# Patient Record
Sex: Female | Born: 1955
Health system: Southern US, Community
[De-identification: ages and names within clinical notes are randomized; demographics above are authoritative.]

## PROBLEM LIST (undated history)

## (undated) DIAGNOSIS — J45909 Unspecified asthma, uncomplicated: Secondary | ICD-10-CM

---

## 1998-12-15 ENCOUNTER — Emergency Department (HOSPITAL_COMMUNITY): Admission: EM | Admit: 1998-12-15 | Discharge: 1998-12-15 | Payer: Self-pay | Admitting: Emergency Medicine

## 1998-12-15 ENCOUNTER — Encounter: Payer: Self-pay | Admitting: Emergency Medicine

## 2003-08-04 ENCOUNTER — Emergency Department (HOSPITAL_COMMUNITY): Admission: EM | Admit: 2003-08-04 | Discharge: 2003-08-04 | Payer: Self-pay | Admitting: Emergency Medicine

## 2004-11-19 ENCOUNTER — Inpatient Hospital Stay (HOSPITAL_COMMUNITY): Admission: AD | Admit: 2004-11-19 | Discharge: 2004-11-19 | Payer: Self-pay | Admitting: *Deleted

## 2004-11-22 ENCOUNTER — Inpatient Hospital Stay (HOSPITAL_COMMUNITY): Admission: AD | Admit: 2004-11-22 | Discharge: 2004-11-22 | Payer: Self-pay | Admitting: *Deleted

## 2011-03-24 ENCOUNTER — Observation Stay (HOSPITAL_COMMUNITY)
Admission: EM | Admit: 2011-03-24 | Discharge: 2011-03-24 | Disposition: A | Discharge status: Home / Self Care | Payer: Self-pay | Attending: Emergency Medicine | Admitting: Emergency Medicine

## 2011-03-24 DIAGNOSIS — J45909 Unspecified asthma, uncomplicated: Principal | ICD-10-CM | POA: Insufficient documentation

## 2013-07-24 ENCOUNTER — Ambulatory Visit: Payer: Self-pay | Admitting: Emergency Medicine

## 2013-07-24 VITALS — BP 120/80 | HR 96 | Temp 98.3°F | Resp 12 | Ht 62.75 in | Wt 146.0 lb

## 2013-07-24 DIAGNOSIS — J018 Other acute sinusitis: Secondary | ICD-10-CM

## 2013-07-24 DIAGNOSIS — J209 Acute bronchitis, unspecified: Secondary | ICD-10-CM

## 2013-07-24 MED ORDER — ALBUTEROL SULFATE (2.5 MG/3ML) 0.083% IN NEBU: 5.0000 mg | INHALATION_SOLUTION | RESPIRATORY_TRACT | Status: DC | PRN

## 2013-07-24 MED ORDER — AMOXICILLIN-POT CLAVULANATE 875-125 MG PO TABS: 1.0000 | ORAL_TABLET | Freq: Two times a day (BID) | ORAL | Status: DC

## 2013-07-24 MED ORDER — PROMETHAZINE-CODEINE 6.25-10 MG/5ML PO SYRP: 5.0000 mL | ORAL_SOLUTION | Freq: Four times a day (QID) | ORAL | Status: DC | PRN

## 2013-07-24 MED ORDER — PSEUDOEPHEDRINE-GUAIFENESIN ER 60-600 MG PO TB12: 1.0000 | ORAL_TABLET | Freq: Two times a day (BID) | ORAL | Status: AC

## 2013-07-24 NOTE — Addendum Note (Signed)
Addended by: Carmelina DaneANDERSON, Lejla Moeser S on: 07/24/2013 10:52 AM   Modules accepted: Orders

## 2013-07-24 NOTE — Patient Instructions (Signed)
bBronchitis Bronchitis is inflammation of the airways that extend from the windpipe into the lungs (bronchi). The inflammation often causes mucus to develop, which leads to a cough. If the inflammation becomes severe, it may cause shortness of breath. CAUSES  Bronchitis may be caused by:   Viral infections.   Bacteria.   Cigarette smoke.   Allergens, pollutants, and other irritants.  SIGNS AND SYMPTOMS  The most common symptom of bronchitis is a frequent cough that produces mucus. Other symptoms include:  Fever.   Body aches.   Chest congestion.   Chills.   Shortness of breath.   Sore throat.  DIAGNOSIS  Bronchitis is usually diagnosed through a medical history and physical exam. Tests, such as chest X-rays, are sometimes done to rule out other conditions.  TREATMENT  You may need to avoid contact with whatever caused the problem (smoking, for example). Medicines are sometimes needed. These may include:  Antibiotics. These may be prescribed if the condition is caused by bacteria.  Cough suppressants. These may be prescribed for relief of cough symptoms.   Inhaled medicines. These may be prescribed to help open your airways and make it easier for you to breathe.   Steroid medicines. These may be prescribed for those with recurrent (chronic) bronchitis. HOME CARE INSTRUCTIONS  Get plenty of rest.   Drink enough fluids to keep your urine clear or pale yellow (unless you have a medical condition that requires fluid restriction). Increasing fluids may help thin your secretions and will prevent dehydration.   Only take over-the-counter or prescription medicines as directed by your health care provider.  Only take antibiotics as directed. Make sure you finish them even if you start to feel better.  Avoid secondhand smoke, irritating chemicals, and strong fumes. These will make bronchitis worse. If you are a smoker, quit smoking. Consider using nicotine gum or  skin patches to help control withdrawal symptoms. Quitting smoking will help your lungs heal faster.   Put a cool-mist humidifier in your bedroom at night to moisten the air. This may help loosen mucus. Change the water in the humidifier daily. You can also run the hot water in your shower and sit in the bathroom with the door closed for 5 10 minutes.   Follow up with your health care provider as directed.   Wash your hands frequently to avoid catching bronchitis again or spreading an infection to others.  SEEK MEDICAL CARE IF: Your symptoms do not improve after 1 week of treatment.  SEEK IMMEDIATE MEDICAL CARE IF:  Your fever increases.  You have chills.   You have chest pain.   You have worsening shortness of breath.   You have bloody sputum.  You faint.  You have lightheadedness.  You have a severe headache.   You vomit repeatedly. MAKE SURE YOU:   Understand these instructions.  Will watch your condition.  Will get help right away if you are not doing well or get worse. Document Released: 05/19/2005 Document Revised: 03/09/2013 Document Reviewed: 01/11/2013 Exit[employer] Patient Information 2014 [employer]. Sinusitis Sinusitis is redness, soreness, and swelling (inflammation) of the paranasal sinuses. Paranasal sinuses are air pockets within the bones of your face (beneath the eyes, the middle of the forehead, or above the eyes). In healthy paranasal sinuses, mucus is able to drain out, and air is able to circulate through them by way of your nose. However, when your paranasal sinuses are inflamed, mucus and air can become trapped. This can allow bacteria and other   germs to grow and cause infection. Sinusitis can develop quickly and last only a short time (acute) or continue over a long period (chronic). Sinusitis that lasts for more than 12 weeks is considered chronic.  CAUSES  Causes of sinusitis include:  Allergies.  Structural abnormalities, such as  displacement of the cartilage that separates your nostrils (deviated septum), which can decrease the air flow through your nose and sinuses and affect sinus drainage.  Functional abnormalities, such as when the small hairs (cilia) that line your sinuses and help remove mucus do not work properly or are not present. SYMPTOMS  Symptoms of acute and chronic sinusitis are the same. The primary symptoms are pain and pressure around the affected sinuses. Other symptoms include:  Upper toothache.  Earache.  Headache.  Bad breath.  Decreased sense of smell and taste.  A cough, which worsens when you are lying flat.  Fatigue.  Fever.  Thick drainage from your nose, which often is green and may contain pus (purulent).  Swelling and warmth over the affected sinuses. DIAGNOSIS  Your caregiver will perform a physical exam. During the exam, your caregiver may:  Look in your nose for signs of abnormal growths in your nostrils (nasal polyps).  Tap over the affected sinus to check for signs of infection.  View the inside of your sinuses (endoscopy) with a special imaging device with a light attached (endoscope), which is inserted into your sinuses. If your caregiver suspects that you have chronic sinusitis, one or more of the following tests may be recommended:  Allergy tests.  Nasal culture A sample of mucus is taken from your nose and sent to a lab and screened for bacteria.  Nasal cytology A sample of mucus is taken from your nose and examined by your caregiver to determine if your sinusitis is related to an allergy. TREATMENT  Most cases of acute sinusitis are related to a viral infection and will resolve on their own within 10 days. Sometimes medicines are prescribed to help relieve symptoms (pain medicine, decongestants, nasal steroid sprays, or saline sprays).  However, for sinusitis related to a bacterial infection, your caregiver will prescribe antibiotic medicines. These are  medicines that will help kill the bacteria causing the infection.  Rarely, sinusitis is caused by a fungal infection. In theses cases, your caregiver will prescribe antifungal medicine. For some cases of chronic sinusitis, surgery is needed. Generally, these are cases in which sinusitis recurs more than 3 times per year, despite other treatments. HOME CARE INSTRUCTIONS   Drink plenty of water. Water helps thin the mucus so your sinuses can drain more easily.  Use a humidifier.  Inhale steam 3 to 4 times a day (for example, sit in the bathroom with the shower running).  Apply a warm, moist washcloth to your face 3 to 4 times a day, or as directed by your caregiver.  Use saline nasal sprays to help moisten and clean your sinuses.  Take over-the-counter or prescription medicines for pain, discomfort, or fever only as directed by your caregiver. SEEK IMMEDIATE MEDICAL CARE IF:  You have increasing pain or severe headaches.  You have nausea, vomiting, or drowsiness.  You have swelling around your face.  You have vision problems.  You have a stiff neck.  You have difficulty breathing. MAKE SURE YOU:   Understand these instructions.  Will watch your condition.  Will get help right away if you are not doing well or get worse. Document Released: 05/19/2005 Document Revised: 08/11/2011 Document Reviewed:   06/03/2011 Exit[employer] Patient Information 2014 [employer].  

## 2013-07-24 NOTE — Addendum Note (Signed)
Addended by: Eddie CandleLUCK, Jayma Volpi L on: 07/24/2013 11:08 AM   Modules accepted: Orders

## 2013-07-24 NOTE — Progress Notes (Signed)
[facility] [address], [place] [address] [phone]  Date:  07/24/2013   Name:  Brenda Gibbonheria A Neuzil   DOB:  1955/06/05   MRN:  [id number]  PCP:  No primary provider on file.    Chief Complaint: Otalgia, Cough, Sore Throat, Chest Pain and Generalized Body Aches   History of Present Illness:  Brenda Armstrong is a 58 y.o. very pleasant female patient who presents with the following:  Ill since Tuesday with nasal congestion and mucopurulent drainage, post nasal drip.  Has a cough productive of purulent sputum.  Some wheezing; history of asthma. No shortness of breath. No improvement with wheezing. Having back pain with protracted coughing.  No nausea or vomiting.  No stool change.  No rash.  No improvement with over the counter medications or other home remedies. Denies other complaint or health concern today.   There are no active problems to display for this patient.   No past medical history on file.  No past surgical history on file.  History  Substance Use Topics  . Smoking status: Former Smoker -- 0.10 packs/day for 2 years    Types: Cigarettes    Quit date: 07/25/2011  . Smokeless tobacco: Never Used  . Alcohol Use: Not on file    Family History  Problem Relation Age of Onset  . Heart disease Sister     No Known Allergies  Medication list has been reviewed and updated.  No current outpatient prescriptions on file prior to visit.   No current facility-administered medications on file prior to visit.    Review of Systems:  As per HPI, otherwise negative.    Physical Examination: Filed Vitals:   07/24/13 1030  BP: 120/80  Pulse: 96  Temp: 98.3 F (36.8 C)  Resp: 12   Filed Vitals:   07/24/13 1030  Height: 5' 2.75" (1.594 m)  Weight: 146 lb (66.225 kg)   Body mass index is 26.06 kg/(m^2). Ideal Body Weight: Weight in (lb) to have BMI = 25: 139.7  GEN: WDWN, NAD, Non-toxic, A & O x 3 HEENT: Atraumatic,  Normocephalic. Neck supple. No masses, No LAD. Ears and Nose: No external deformity. CV: RRR, No M/G/R. No JVD. No thrill. No extra heart sounds. PULM: CTA B, no wheezes, crackles, rhonchi. No retractions. No resp. distress. No accessory muscle use. ABD: S, NT, ND, +BS. No rebound. No HSM. EXTR: No c/c/e NEURO Normal gait.  PSYCH: Normally interactive. Conversant. Not depressed or anxious appearing.  Calm demeanor.    Assessment and Plan: Bronchitis with bronchospasm Albuterol mucinex d Phen c cod augmentin   Signed,  Phillips OdorJeffery Cintia Gleed, MD

## 2013-09-30 ENCOUNTER — Ambulatory Visit (INDEPENDENT_AMBULATORY_CARE_PROVIDER_SITE_OTHER): Payer: No Typology Code available for payment source | Admitting: Emergency Medicine

## 2013-09-30 VITALS — BP 110/62 | HR 65 | Temp 98.3°F | Resp 16 | Ht 62.75 in | Wt 145.4 lb

## 2013-09-30 DIAGNOSIS — S43499A Other sprain of unspecified shoulder joint, initial encounter: Secondary | ICD-10-CM

## 2013-09-30 DIAGNOSIS — S46819A Strain of other muscles, fascia and tendons at shoulder and upper arm level, unspecified arm, initial encounter: Secondary | ICD-10-CM

## 2013-09-30 MED ORDER — ACETAMINOPHEN-CODEINE #3 300-30 MG PO TABS: 1.0000 | ORAL_TABLET | ORAL | Status: DC | PRN

## 2013-09-30 MED ORDER — NAPROXEN SODIUM 550 MG PO TABS: 550.0000 mg | ORAL_TABLET | Freq: Two times a day (BID) | ORAL | Status: DC

## 2013-09-30 MED ORDER — CYCLOBENZAPRINE HCL 10 MG PO TABS: 10.0000 mg | ORAL_TABLET | Freq: Three times a day (TID) | ORAL | Status: DC | PRN

## 2013-09-30 NOTE — Progress Notes (Signed)
[facility] [address], [place] [address] [phone]  Date:  09/30/2013   Name:  Brenda Armstrong   DOB:  1956/04/23   MRN:  [id number]  PCP:  No PCP Per Patient    Chief Complaint: Arm Pain   History of Present Illness:  Brenda Armstrong is a 58 y.o. very pleasant female patient who presents with the following:  Patient has a spontaneous pain in the left shoulder over the past month or so in the absence of injury or overuse.  Pain starts in posterior shoulder and passes posteriorly down posterior upper arm to midway.  No weakness or tingling in arm.  No neck pain.  Says it is so bad she cannot reach her ear with that hand.  Nothing but rest seems to improve the pain.  No improvement with over the counter medications or other home remedies. Denies other complaint or health concern today.   There are no active problems to display for this patient.   History reviewed. No pertinent past medical history.  History reviewed. No pertinent past surgical history.  History  Substance Use Topics  . Smoking status: Former Smoker -- 0.10 packs/day for 2 years    Types: Cigarettes    Quit date: 07/25/2011  . Smokeless tobacco: Never Used  . Alcohol Use: Not on file    Family History  Problem Relation Age of Onset  . Heart disease Sister     No Known Allergies  Medication list has been reviewed and updated.  Current Outpatient Prescriptions on File Prior to Visit  Medication Sig Dispense Refill  . albuterol (PROVENTIL HFA;VENTOLIN HFA) 108 (90 BASE) MCG/ACT inhaler Inhale 2 puffs into the lungs every 6 (six) hours as needed for wheezing or shortness of breath.      Marland Kitchen. albuterol (PROVENTIL) (2.5 MG/3ML) 0.083% nebulizer solution Take 6 mLs (5 mg total) by nebulization every 4 (four) hours as needed for wheezing or shortness of breath.  150 mL  1  . budesonide-formoterol (SYMBICORT) 80-4.5 MCG/ACT inhaler Inhale 2 puffs into the lungs 2 (two) times daily.       Marland Kitchen. amoxicillin-clavulanate (AUGMENTIN) 875-125 MG per tablet Take 1 tablet by mouth 2 (two) times daily.  20 tablet  0  . promethazine-codeine (PHENERGAN WITH CODEINE) 6.25-10 MG/5ML syrup Take 5-10 mLs by mouth every 6 (six) hours as needed.  120 mL  0  . pseudoephedrine-guaifenesin (MUCINEX D) 60-600 MG per tablet Take 1 tablet by mouth every 12 (twelve) hours.  18 tablet  0   No current facility-administered medications on file prior to visit.    Review of Systems:  As per HPI, otherwise negative.    Physical Examination: Filed Vitals:   09/30/13 1053  BP: 110/62  Pulse: 65  Temp: 98.3 F (36.8 C)  Resp: 16   Filed Vitals:   09/30/13 1053  Height: 5' 2.75" (1.594 m)  Weight: 145 lb 6.4 oz (65.953 kg)   Body mass index is 25.96 kg/(m^2). Ideal Body Weight: Weight in (lb) to have BMI = 25: 139.7   GEN: WDWN, NAD, Non-toxic, Alert & Oriented x 3 HEENT: Atraumatic, Normocephalic.  Ears and Nose: No external deformity. EXTR: No clubbing/cyanosis/edema NEURO: Normal gait.  PSYCH: Normally interactive. Conversant. Not depressed or anxious appearing.  Calm demeanor.  Shoulder:  Full PROM.  Tender trapezius.  Neuro grossly intact.  Motor intact Assessment and Plan: Trapezius strain Anaprox Flexeril Local heat   Signed,  Phillips OdorJeffery Stacey Maura, MD

## 2013-09-30 NOTE — Patient Instructions (Signed)
   Shoulder Pain The shoulder is the joint that connects your arms to your body. The bones that form the shoulder joint include the upper arm bone (humerus), the shoulder blade (scapula), and the collarbone (clavicle). The top of the humerus is shaped like a ball and fits into a rather flat socket on the scapula (glenoid cavity). A combination of muscles and strong, fibrous tissues that connect muscles to bones (tendons) support your shoulder joint and hold the ball in the socket. Small, fluid-filled sacs (bursae) are located in different areas of the joint. They act as cushions between the bones and the overlying soft tissues and help reduce friction between the gliding tendons and the bone as you move your arm. Your shoulder joint allows a wide range of motion in your arm. This range of motion allows you to do things like scratch your back or throw a ball. However, this range of motion also makes your shoulder more prone to pain from overuse and injury. Causes of shoulder pain can originate from both injury and overuse and usually can be grouped in the following four categories:  Redness, swelling, and pain (inflammation) of the tendon (tendinitis) or the bursae (bursitis).  Instability, such as a dislocation of the joint.  Inflammation of the joint (arthritis).  Broken bone (fracture). HOME CARE INSTRUCTIONS   Apply ice to the sore area.  Put ice in a plastic bag.  Place a towel between your skin and the bag.  Leave the ice on for 15-20 minutes, 03-04 times per day for the first 2 days.  Stop using cold packs if they do not help with the pain.  If you have a shoulder sling or immobilizer, wear it as long as your caregiver instructs. Only remove it to shower or bathe. Move your arm as little as possible, but keep your hand moving to prevent swelling.  Squeeze a soft ball or foam pad as much as possible to help prevent swelling.  Only take over-the-counter or prescription medicines for  pain, discomfort, or fever as directed by your caregiver. SEEK MEDICAL CARE IF:   Your shoulder pain increases, or new pain develops in your arm, hand, or fingers.  Your hand or fingers become cold and numb.  Your pain is not relieved with medicines. SEEK IMMEDIATE MEDICAL CARE IF:   Your arm, hand, or fingers are numb or tingling.  Your arm, hand, or fingers are significantly swollen or turn white or blue. MAKE SURE YOU:   Understand these instructions.  Will watch your condition.  Will get help right away if you are not doing well or get worse. Document Released: 02/26/2005 Document Revised: 02/11/2012 Document Reviewed: 05/03/2011 [employer] Patient Information 2014 [employer].  

## 2014-09-04 ENCOUNTER — Ambulatory Visit (INDEPENDENT_AMBULATORY_CARE_PROVIDER_SITE_OTHER): Payer: 59

## 2014-09-04 ENCOUNTER — Ambulatory Visit (INDEPENDENT_AMBULATORY_CARE_PROVIDER_SITE_OTHER): Payer: 59 | Admitting: Family Medicine

## 2014-09-04 VITALS — BP 122/82 | HR 90 | Temp 98.3°F | Resp 17 | Ht 63.0 in | Wt 159.0 lb

## 2014-09-04 DIAGNOSIS — R0989 Other specified symptoms and signs involving the circulatory and respiratory systems: Secondary | ICD-10-CM

## 2014-09-04 DIAGNOSIS — R062 Wheezing: Secondary | ICD-10-CM

## 2014-09-04 DIAGNOSIS — Z8709 Personal history of other diseases of the respiratory system: Secondary | ICD-10-CM

## 2014-09-04 DIAGNOSIS — R0602 Shortness of breath: Secondary | ICD-10-CM | POA: Diagnosis not present

## 2014-09-04 DIAGNOSIS — R059 Cough, unspecified: Secondary | ICD-10-CM

## 2014-09-04 DIAGNOSIS — J209 Acute bronchitis, unspecified: Secondary | ICD-10-CM

## 2014-09-04 DIAGNOSIS — R05 Cough: Secondary | ICD-10-CM | POA: Diagnosis not present

## 2014-09-04 LAB — POCT CBC
Granulocyte percent: 66.1 %G (ref 37–80)
HCT, POC: 41.9 % (ref 37.7–47.9)
HEMOGLOBIN: 13 g/dL (ref 12.2–16.2)
LYMPH, POC: 2.7 (ref 0.6–3.4)
MCH, POC: 26.8 pg — AB (ref 27–31.2)
MCHC: 31 g/dL — AB (ref 31.8–35.4)
MCV: 86.6 fL (ref 80–97)
MID (cbc): 0.4 (ref 0–0.9)
MPV: 7.4 fL (ref 0–99.8)
POC GRANULOCYTE: 6.1 (ref 2–6.9)
POC LYMPH PERCENT: 29.3 %L (ref 10–50)
POC MID %: 4.6 %M (ref 0–12)
Platelet Count, POC: 199 10*3/uL (ref 142–424)
RBC: 4.84 M/uL (ref 4.04–5.48)
RDW, POC: 15.7 %
WBC: 9.2 10*3/uL (ref 4.6–10.2)

## 2014-09-04 MED ORDER — AZITHROMYCIN 500 MG PO TABS: 500.0000 mg | ORAL_TABLET | Freq: Every day | ORAL | Status: DC

## 2014-09-04 MED ORDER — HYDROCODONE-HOMATROPINE 5-1.5 MG/5ML PO SYRP: 5.0000 mL | ORAL_SOLUTION | Freq: Every evening | ORAL | Status: DC | PRN

## 2014-09-04 MED ORDER — PREDNISONE 20 MG PO TABS: 40.0000 mg | ORAL_TABLET | Freq: Every day | ORAL | Status: AC

## 2014-09-04 NOTE — Patient Instructions (Signed)

## 2014-09-04 NOTE — Progress Notes (Signed)
MRN: [id number] DOB: Jun 21, 1955  Subjective:   Brenda Armstrong is a 59 y.o. female with pmh of asthma presenting for chief complaint of Cough; Nasal Congestion; Allergies; Asthma; Hot Flashes; Insomnia; and Generalized Body Aches  Reports 3 day history of cough productive with sputum, cough worse at night, subjective fever, chills, sinus pressure, shob, mild wheezing. Patient also admits that she has had intermittent RUQ pain, pain is achy in nature, sometimes elicited by coughing, radiates to right shoulder; this pain also started in the last 3 days. She has a history of asthma, currently using albuterol inhaler every 4 hours, tried nebulizer x1 yesterday with some improvement. Generally, she uses albuterol as needed, 3-5 times per week. Admits history of seasonal allergies, has not tried any allergy medicine. Denies itchy or watery eyes, sinus pain, rhinorrhea, ear pain, ear drainage, throat pain, chest pain, n/v, diarrhea. Smokes ~1 cigarette per day. Drinks 1-2 tequila shots per day. Denies any other aggravating or relieving factors, no other questions or concerns.  Brenda Armstrong has a current medication list which includes the following prescription(s): acetaminophen-codeine and albuterol. She has No Known Allergies.  Brenda Armstrong  has no past medical history on file. Also  has no past surgical history on file.  ROS As in subjective.  Objective:   Vitals: BP 122/82 mmHg  Pulse 90  Temp(Src) 98.3 F (36.8 C) (Oral)  Resp 17  Ht 5\' 3"  (1.6 m)  Wt 159 lb (72.122 kg)  BMI 28.17 kg/m2  SpO2 97%  Physical Exam  Constitutional: She is oriented to person, place, and time and well-developed, well-nourished, and in no distress.  HENT:  TM's intact bilaterally, no effusions or erythema. Nasal turbinates pink and moist with mild clear-yellow rhinorrhea. No sinus tenderness. No oropharyngeal exudates, erythema or abscesses.   Eyes: Conjunctivae and EOM are normal. Right eye exhibits no discharge.  Left eye exhibits no discharge. No scleral icterus.  Cardiovascular: Normal rate, regular rhythm and intact distal pulses.  Exam reveals no gallop and no friction rub.   No murmur heard. Pulmonary/Chest: No stridor. No respiratory distress. She has wheezes (diffuse expiratory wheezing). She has no rales. She exhibits no tenderness.  Abdominal: She exhibits no distension and no mass. There is no tenderness.  Lymphadenopathy:    She has no cervical adenopathy.  Neurological: She is alert and oriented to person, place, and time.  Skin: Skin is warm and dry. No rash noted. No erythema. No pallor.    [facility] reading (PRIMARY) by  Dr. Milus GlazierLauenstein and PA-Mani. Chest x-ray: Increased interstitial markings.  Dg Chest 2 View  09/04/2014   CLINICAL DATA:  Three-day history of difficulty breathing with cough and congestion  EXAM: CHEST  2 VIEW  COMPARISON:  None.  FINDINGS: There is no edema or consolidation. The heart size and pulmonary vascularity are normal. No adenopathy. No bone lesions.  IMPRESSION: No edema or consolidation.   Electronically Signed   By: Bretta BangWilliam  Woodruff III M.D.   On: 09/04/2014 09:45   Results for orders placed or performed in visit on 09/04/14 (from the past 24 hour(s))  POCT CBC     Status: Abnormal   Collection Time: 09/04/14  8:42 AM  Result Value Ref Range   WBC 9.2 4.6 - 10.2 K/uL   Lymph, poc 2.7 0.6 - 3.4   POC LYMPH PERCENT 29.3 10 - 50 %L   MID (cbc) 0.4 0 - 0.9   POC MID % 4.6 0 - 12 %M   POC Granulocyte  6.1 2 - 6.9   Granulocyte percent 66.1 37 - 80 %G   RBC 4.84 4.04 - 5.48 M/uL   Hemoglobin 13.0 12.2 - 16.2 g/dL   HCT, POC 16.1 09.6 - 47.9 %   MCV 86.6 80 - 97 fL   MCH, POC 26.8 (A) 27 - 31.2 pg   MCHC 31.0 (A) 31.8 - 35.4 g/dL   RDW, POC 04.5 %   Platelet Count, POC 199 142 - 424 K/uL   MPV 7.4 0 - 99.8 fL   Assessment and Plan :   1. Wheezing 2. Chest congestion 3. Cough 4. Shortness of breath 5. History of asthma 6. Acute bronchitis, unspecified  organism - Start azithromycin, prednisone x5 days, nebulizer treatment at home (patient declined nebulizer treatment in clinic), advised to stop smoking and patient agreed. Consider inhaled steroid if symptoms persist. - Patient declined CMet today, discussed RUQ differential including gall stones, cholecystitis, hepatitis, patient verbalized understanding and will return to clinic for a lab draw once she gets her insurance situated. Will return to clinic if symptoms do not resolve or worsen.  Wallis Bamberg, PA-C [facility] [phone] 09/04/2014 8:16 AM

## 2015-05-28 ENCOUNTER — Emergency Department (HOSPITAL_COMMUNITY): Payer: Worker's Compensation

## 2015-05-28 ENCOUNTER — Encounter (HOSPITAL_COMMUNITY): Payer: Self-pay

## 2015-05-28 ENCOUNTER — Emergency Department (HOSPITAL_COMMUNITY)
Admission: EM | Admit: 2015-05-28 | Discharge: 2015-05-28 | Disposition: A | Discharge status: Home / Self Care | Payer: Worker's Compensation | Attending: Emergency Medicine | Admitting: Emergency Medicine

## 2015-05-28 DIAGNOSIS — M546 Pain in thoracic spine: Secondary | ICD-10-CM | POA: Insufficient documentation

## 2015-05-28 DIAGNOSIS — M79601 Pain in right arm: Secondary | ICD-10-CM | POA: Diagnosis present

## 2015-05-28 DIAGNOSIS — M542 Cervicalgia: Secondary | ICD-10-CM | POA: Diagnosis not present

## 2015-05-28 DIAGNOSIS — Z79899 Other long term (current) drug therapy: Secondary | ICD-10-CM | POA: Diagnosis not present

## 2015-05-28 DIAGNOSIS — T148 Other injury of unspecified body region: Secondary | ICD-10-CM | POA: Insufficient documentation

## 2015-05-28 DIAGNOSIS — F17210 Nicotine dependence, cigarettes, uncomplicated: Secondary | ICD-10-CM | POA: Insufficient documentation

## 2015-05-28 DIAGNOSIS — T148XXA Other injury of unspecified body region, initial encounter: Secondary | ICD-10-CM

## 2015-05-28 LAB — CBC WITH DIFFERENTIAL/PLATELET
BASOS ABS: 0 10*3/uL (ref 0.0–0.1)
BASOS PCT: 1 %
Eosinophils Absolute: 0.5 10*3/uL (ref 0.0–0.7)
Eosinophils Relative: 8 %
HEMATOCRIT: 38.9 % (ref 36.0–46.0)
HEMOGLOBIN: 12.5 g/dL (ref 12.0–15.0)
LYMPHS PCT: 39 %
Lymphs Abs: 2.3 10*3/uL (ref 0.7–4.0)
MCH: 27.9 pg (ref 26.0–34.0)
MCHC: 32.1 g/dL (ref 30.0–36.0)
MCV: 86.8 fL (ref 78.0–100.0)
Monocytes Absolute: 0.3 10*3/uL (ref 0.1–1.0)
Monocytes Relative: 5 %
NEUTROS ABS: 2.8 10*3/uL (ref 1.7–7.7)
NEUTROS PCT: 47 %
Platelets: 183 10*3/uL (ref 150–400)
RBC: 4.48 MIL/uL (ref 3.87–5.11)
RDW: 14.5 % (ref 11.5–15.5)
WBC: 5.8 10*3/uL (ref 4.0–10.5)

## 2015-05-28 LAB — BASIC METABOLIC PANEL
ANION GAP: 8 (ref 5–15)
BUN: 11 mg/dL (ref 6–20)
CALCIUM: 9.1 mg/dL (ref 8.9–10.3)
CO2: 25 mmol/L (ref 22–32)
Chloride: 106 mmol/L (ref 101–111)
Creatinine, Ser: 0.74 mg/dL (ref 0.44–1.00)
GLUCOSE: 124 mg/dL — AB (ref 65–99)
POTASSIUM: 4.3 mmol/L (ref 3.5–5.1)
SODIUM: 139 mmol/L (ref 135–145)

## 2015-05-28 LAB — TROPONIN I: Troponin I: <0.03 ng/mL (ref <0.031)

## 2015-05-28 MED ORDER — ONDANSETRON HCL 4 MG/2ML IJ SOLN
4.0000 mg | Freq: Once | INTRAMUSCULAR | Status: AC
  Administered 2015-05-28: 4 mg via INTRAVENOUS
  Filled 2015-05-28: qty 2

## 2015-05-28 MED ORDER — HYDROCODONE-ACETAMINOPHEN 5-325 MG PO TABS: 1.0000 | ORAL_TABLET | Freq: Four times a day (QID) | ORAL | Status: DC | PRN

## 2015-05-28 MED ORDER — CYCLOBENZAPRINE HCL 5 MG PO TABS: 5.0000 mg | ORAL_TABLET | Freq: Three times a day (TID) | ORAL | Status: DC | PRN

## 2015-05-28 MED ORDER — MORPHINE SULFATE (PF) 4 MG/ML IV SOLN
4.0000 mg | Freq: Once | INTRAVENOUS | Status: AC
  Administered 2015-05-28: 4 mg via INTRAVENOUS
  Filled 2015-05-28 (×2): qty 1

## 2015-05-28 NOTE — ED Provider Notes (Signed)
CSN: [id number]     Arrival date & time 05/28/15  [id number] History   First MD Initiated Contact with Patient 05/28/15 [phone]     Chief Complaint  Patient presents with  . Arm Pain     (Consider location/radiation/quality/duration/timing/severity/associated sxs/prior Treatment) HPI Comments: Pt comes in with c/o right arm and back and right cervical spine pain that started about 1 week ago. Denies numbness, weakness or sob. Denies any recent injury. States that she dropped a large pot on the arm over a month ago but the pain didn't start then. She states that at the time she had a lot of bruising to her upper arm. She has been taking ibuprofen consistently without relief.  The history is provided by the patient.    No past medical history on file. No past surgical history on file. Family History  Problem Relation Age of Onset  . Heart disease Sister    Social History  Substance Use Topics  . Smoking status: Current Some Day Smoker -- 0.10 packs/day for 3 years    Types: Cigarettes    Last Attempt to Quit: 07/25/2011  . Smokeless tobacco: Never Used  . Alcohol Use: Not on file   OB History    No data available     Review of Systems  All other systems reviewed and are negative.     Allergies  Review of patient's allergies indicates no known allergies.  Home Medications   Prior to Admission medications   Medication Sig Start Date End Date Taking? Authorizing Provider  acetaminophen-codeine (TYLENOL #3) 300-30 MG per tablet Take 1-2 tablets by mouth every 4 (four) hours as needed. 09/30/13   Carmelina DaneJeffery S Anderson, MD  albuterol (PROVENTIL HFA;VENTOLIN HFA) 108 (90 BASE) MCG/ACT inhaler Inhale 2 puffs into the lungs every 6 (six) hours as needed for wheezing or shortness of breath.    Historical Provider, MD  azithromycin (ZITHROMAX) 500 MG tablet Take 1 tablet (500 mg total) by mouth daily. 09/04/14   Wallis BambergMario Mani, PA-C  HYDROcodone-homatropine [facility](HYCODAN) 5-1.5 MG/5ML syrup Take 5 mLs by  mouth at bedtime as needed. 09/04/14   Wallis BambergMario Mani, PA-C   BP 170/111 mmHg  Pulse 67  Temp(Src) 97.4 F (36.3 C) (Oral)  Resp 16  Ht 5\' 2"  (1.575 m)  Wt 71.668 kg  BMI 28.89 kg/m2  SpO2 100% Physical Exam  Constitutional: She is oriented to person, place, and time. She appears well-developed and well-nourished.  Cardiovascular: Normal rate and regular rhythm.   Pulmonary/Chest: Effort normal and breath sounds normal.  Musculoskeletal:  Right cervical and thoracic paraspinal tenderness. Pulses intact. Equal grip strength bilaterally  Neurological: She is alert and oriented to person, place, and time. She exhibits normal muscle tone. Coordination normal.  Skin: Skin is warm and dry.  Psychiatric: She has a normal mood and affect.  Nursing note and vitals reviewed.   ED Course  Procedures (including critical care time) Labs Review Labs Reviewed  BASIC METABOLIC PANEL - Abnormal; Notable for the following:    Glucose, Bld 124 (*)    All other components within normal limits  CBC WITH DIFFERENTIAL/PLATELET  TROPONIN I    Imaging Review Dg Chest 2 View  05/28/2015  CLINICAL DATA:  Left upper chest pain for 2 weeks now is unbearable EXAM: CHEST - 2 VIEW COMPARISON:  09/04/2014 FINDINGS: Mild hyperinflation. Peribronchial thickening centrally. Coarse interstitial markings bilaterally, stable. No confluent airspace disease or overt edema. Heart size upper limits normal. Tortuous thoracic aorta. No effusion.  No pneumothorax. Mild spondylitic changes in the mid thoracic spine. IMPRESSION: Chronic interstitial lung disease without acute or superimposed abnormality. Electronically Signed   By: Corlis Leak M.D.   On: 05/28/2015 09:03   I have personally reviewed and evaluated these images and lab results as part of my medical decision-making.  ED ECG REPORT   Date: 05/28/2015  Rate:60  Rhythm: normal sinus rhythm  QRS Axis: normal  Intervals: normal  ST/T Wave abnormalities: normal   Conduction Disutrbances:none  Narrative Interpretation:   Old EKG Reviewed: none available  I have personally reviewed the EKG tracing and agree with the computerized printout as noted.   MDM   Final diagnoses:  Muscle strain    8:12 AM Think likely musculoskeletal in nature however think that a cardiac r/o is needed as the pain is radiating to the neck and back  9:32 AM Pt is feeling a lot better with the medication. Pt is neurologically intact. Dicussed follow up with ortho as needed. Discussed with pt that she needs to have her bp rechecked in the next 2 weeks to be sur it doesn't continue to be elevated  Teressa Lower, NP 05/28/15 1610  Cathren Laine, MD 05/28/15 1220

## 2015-05-28 NOTE — ED Notes (Signed)
EKG given to Rubin PayorPickering, NP. Denton LankSteinl, MD made aware

## 2015-05-28 NOTE — ED Notes (Signed)
NOvember 12 th pt. Was injured when a [employer] skillet fell on her rt. Arm.  She has not pain at the time only bruising. Approximately 1 week after the injury, the painto her rt. Arm began and has progressively got worse.  Pain radiates into her rt. Lateral neck and scappula area  She is taking Advil 2-3 pills every 2 hours.  Pt. Can move her arm just the pain increases.  +Radial pulse The seems to hurt  More around her rt. Elbow area.   She denies any chest pain or sob, n/v

## 2015-06-03 ENCOUNTER — Emergency Department (HOSPITAL_COMMUNITY): Payer: BLUE CROSS/BLUE SHIELD

## 2015-06-03 ENCOUNTER — Emergency Department (HOSPITAL_COMMUNITY)
Admission: EM | Admit: 2015-06-03 | Discharge: 2015-06-03 | Disposition: A | Discharge status: Home / Self Care | Payer: BLUE CROSS/BLUE SHIELD | Attending: Emergency Medicine | Admitting: Emergency Medicine

## 2015-06-03 ENCOUNTER — Encounter (HOSPITAL_COMMUNITY): Payer: Self-pay | Admitting: *Deleted

## 2015-06-03 DIAGNOSIS — Z79899 Other long term (current) drug therapy: Secondary | ICD-10-CM | POA: Insufficient documentation

## 2015-06-03 DIAGNOSIS — Z87891 Personal history of nicotine dependence: Secondary | ICD-10-CM | POA: Insufficient documentation

## 2015-06-03 DIAGNOSIS — M25511 Pain in right shoulder: Secondary | ICD-10-CM | POA: Diagnosis present

## 2015-06-03 DIAGNOSIS — J45909 Unspecified asthma, uncomplicated: Secondary | ICD-10-CM | POA: Insufficient documentation

## 2015-06-03 DIAGNOSIS — M5412 Radiculopathy, cervical region: Secondary | ICD-10-CM | POA: Diagnosis not present

## 2015-06-03 HISTORY — DX: Unspecified asthma, uncomplicated: J45.909

## 2015-06-03 MED ORDER — HYDROCODONE-ACETAMINOPHEN 5-325 MG PO TABS: 1.0000 | ORAL_TABLET | ORAL | Status: DC | PRN

## 2015-06-03 MED ORDER — PREDNISONE 10 MG (21) PO TBPK: ORAL_TABLET | ORAL | Status: DC

## 2015-06-03 MED ORDER — NAPROXEN 500 MG PO TABS: 500.0000 mg | ORAL_TABLET | Freq: Two times a day (BID) | ORAL | Status: DC | PRN

## 2015-06-03 NOTE — Discharge Instructions (Signed)

## 2015-06-03 NOTE — ED Provider Notes (Signed)
CSN: [id number]     Arrival date & time 06/03/15  4782 History   First MD Initiated Contact with Patient 06/03/15 [phone]     Chief Complaint  Patient presents with  . Arm Pain   Patient is a 60 y.o. female presenting with arm pain.  Arm Pain   Patient presents to the emergency room for evaluation of persistent right shoulder pain. She was initially injured at work when a pan fell on her shoulder. She initially developed a bruise and had pain and discomfort in the area of bruising on her right upper arm. However, since that time she's had persistent pain. She has pain in the neck and right shoulder area that radiates down her arm. It is an aching pain and seems to be worse when her arm is extended period she feels better when she has her elbow flexed and her arm held out in front of her. She denies any numbness or weakness but her right arm feels different than the left. No chest pain or shortness of breath. No abdominal pain. No fever or vomiting. No complaints in her lower extremities. Past Medical History  Diagnosis Date  . Asthma    History reviewed. No pertinent past surgical history. Family History  Problem Relation Age of Onset  . Heart disease Sister    Social History  Substance Use Topics  . Smoking status: Former Smoker -- 0.10 packs/day for 3 years    Types: Cigarettes    Quit date: 07/25/2011  . Smokeless tobacco: Never Used  . Alcohol Use: None   OB History    No data available     Review of Systems  All other systems reviewed and are negative.     Allergies  Review of patient's allergies indicates no known allergies.  Home Medications   Prior to Admission medications   Medication Sig Start Date End Date Taking? Authorizing Provider  albuterol (PROVENTIL HFA;VENTOLIN HFA) 108 (90 BASE) MCG/ACT inhaler Inhale 2 puffs into the lungs every 6 (six) hours as needed for wheezing or shortness of breath.   Yes Historical Provider, MD  cyclobenzaprine (FLEXERIL) 5 MG  tablet Take 1 tablet (5 mg total) by mouth 3 (three) times daily as needed for muscle spasms. 05/28/15  Yes Teressa Lower, NP  HYDROcodone-acetaminophen (NORCO/VICODIN) 5-325 MG tablet Take 1-2 tablets by mouth every 4 (four) hours as needed. 06/03/15   Linwood Dibbles, MD  naproxen (NAPROSYN) 500 MG tablet Take 1 tablet (500 mg total) by mouth 2 (two) times daily as needed for moderate pain. 06/03/15   Linwood Dibbles, MD  predniSONE (STERAPRED UNI-PAK 21 TAB) 10 MG (21) TBPK tablet Take 6 tabs by mouth daily  for 2 days, then 5 tabs for 2 days, then 4 tabs for 2 days, then 3 tabs for 2 days, 2 tabs for 2 days, then 1 tab by mouth daily for 2 days 06/03/15   Linwood Dibbles, MD   BP 144/102 mmHg  Pulse 79  Temp(Src) 97.7 F (36.5 C) (Oral)  Resp 18  Wt 70.761 kg  SpO2 100% Physical Exam  Constitutional: She appears well-developed and well-nourished. No distress.  HENT:  Head: Normocephalic and atraumatic.  Right Ear: External ear normal.  Left Ear: External ear normal.  Eyes: Conjunctivae are normal. Right eye exhibits no discharge. Left eye exhibits no discharge. No scleral icterus.  Neck: Neck supple. No tracheal deviation present.  Cardiovascular: Normal rate.   Pulmonary/Chest: Effort normal. No stridor. No respiratory distress.  Musculoskeletal: She  exhibits no edema.       Right shoulder: She exhibits no tenderness.       Cervical back: She exhibits no tenderness.  Pain with range of motion of the right shoulder, no discrete areas of tenderness, strong radial pulses bilaterally, normal sensation and grip strength bilaterally  Neurological: She is alert. Cranial nerve deficit: no gross deficits.  Skin: Skin is warm and dry. No rash noted.  Psychiatric: She has a normal mood and affect.  Nursing note and vitals reviewed.   ED Course  Procedures   Imaging Review Dg Cervical Spine Complete  06/03/2015  CLINICAL DATA:  Pt c/o RIGHT shoulder and RIGHT neck pain s/p she reports she was hit with a  Piece of heavy equipment 1 month ago at work at the [employer]. Pt states pain has worsened x 2 days. EXAM: CERVICAL SPINE - COMPLETE 4+ VIEW COMPARISON:  None. FINDINGS: The lateral view images through the bottom of C7. Prevertebral soft tissues are within normal limits. Maintenance of vertebral body height. Straightening of expected lordosis. Advanced spondylosis for age, including endplate osteophytes and loss of intervertebral disc height at C3 through C7. Facets are well-aligned. Multilevel neural foraminal narrowing bilaterally secondary to uncovertebral joint hypertrophy. C1-2 level not well evaluated on open-mouth views. No displaced odontoid process fracture seen. Cervical thoracic junction not well evaluated. IMPRESSION: Suboptimal evaluation of the C1-2 and C7-T1 levels. Advanced spondylosis, with bilateral neural foraminal narrowing at multiple levels. Nonspecific straightening of expected lordosis. Electronically Signed   By: Jeronimo GreavesKyle  Talbot M.D.   On: 06/03/2015 10:57   Dg Shoulder Right  06/03/2015  CLINICAL DATA:  Neck and right arm pain. EXAM: RIGHT SHOULDER - 2+ VIEW COMPARISON:  None. FINDINGS: There is no evidence of fracture or dislocation. There is no evidence of arthropathy or other focal bone abnormality. Soft tissues are unremarkable. IMPRESSION: Normal right shoulder. Electronically Signed   By: Lupita RaiderJames  Green Jr, M.D.   On: 06/03/2015 10:41      MDM   Final diagnoses:  Cervical radiculopathy    Patient's x-ray of her shoulder is unremarkable. Her neck x-ray does show a significant amount of spondylosis and bilateral neural foraminal narrowing.  This correlates with her symptoms suggest a cervical radiculopathy. No weakness on my exam. I'll start the patient on pain medications and a course of steroids. I recommended referral to a neurosurgeon. She may benefit from further outpatient testing such as MRI.    Linwood DibblesJon Allure Greaser, MD 06/03/15 [phone]

## 2015-06-03 NOTE — ED Notes (Signed)
Pt states on Nov 12th a pan fell on rt shoulder leaving a bruise, has had pain in shoulder, arm, rt back and ribs. Seen last Monday for same, feels as if is getting worse. Was given Vicodin and Flexeril without relief.

## 2015-12-20 ENCOUNTER — Other Ambulatory Visit (HOSPITAL_COMMUNITY)
Admission: RE | Admit: 2015-12-20 | Discharge: 2015-12-20 | Disposition: A | Discharge status: Home / Self Care | Payer: BLUE CROSS/BLUE SHIELD | Source: Ambulatory Visit | Attending: Physician Assistant | Admitting: Physician Assistant

## 2015-12-20 ENCOUNTER — Other Ambulatory Visit: Payer: Self-pay | Admitting: Physician Assistant

## 2015-12-20 DIAGNOSIS — Z01411 Encounter for gynecological examination (general) (routine) with abnormal findings: Secondary | ICD-10-CM | POA: Diagnosis not present

## 2015-12-20 DIAGNOSIS — Z124 Encounter for screening for malignant neoplasm of cervix: Secondary | ICD-10-CM | POA: Diagnosis not present

## 2015-12-20 DIAGNOSIS — Z1151 Encounter for screening for human papillomavirus (HPV): Secondary | ICD-10-CM | POA: Diagnosis not present

## 2015-12-20 DIAGNOSIS — Z0000 Encounter for general adult medical examination without abnormal findings: Secondary | ICD-10-CM | POA: Diagnosis not present

## 2015-12-20 DIAGNOSIS — Z720 Tobacco use: Secondary | ICD-10-CM | POA: Diagnosis not present

## 2015-12-20 DIAGNOSIS — J45909 Unspecified asthma, uncomplicated: Secondary | ICD-10-CM | POA: Diagnosis not present

## 2015-12-20 DIAGNOSIS — Z13220 Encounter for screening for lipoid disorders: Secondary | ICD-10-CM | POA: Diagnosis not present

## 2015-12-24 LAB — CYTOLOGY - PAP

## 2016-01-23 ENCOUNTER — Other Ambulatory Visit: Payer: Self-pay | Admitting: Gastroenterology

## 2016-01-23 DIAGNOSIS — Z1211 Encounter for screening for malignant neoplasm of colon: Secondary | ICD-10-CM | POA: Diagnosis not present

## 2016-01-23 DIAGNOSIS — K6389 Other specified diseases of intestine: Secondary | ICD-10-CM | POA: Diagnosis not present

## 2016-01-23 DIAGNOSIS — K635 Polyp of colon: Secondary | ICD-10-CM | POA: Diagnosis not present

## 2016-01-23 DIAGNOSIS — K5730 Diverticulosis of large intestine without perforation or abscess without bleeding: Secondary | ICD-10-CM | POA: Diagnosis not present

## 2016-02-05 DIAGNOSIS — E785 Hyperlipidemia, unspecified: Secondary | ICD-10-CM | POA: Diagnosis not present

## 2016-02-05 DIAGNOSIS — J45909 Unspecified asthma, uncomplicated: Secondary | ICD-10-CM | POA: Diagnosis not present

## 2016-02-05 DIAGNOSIS — Z7289 Other problems related to lifestyle: Secondary | ICD-10-CM | POA: Diagnosis not present

## 2016-02-05 DIAGNOSIS — Z720 Tobacco use: Secondary | ICD-10-CM | POA: Diagnosis not present

## 2016-03-26 DIAGNOSIS — Z23 Encounter for immunization: Secondary | ICD-10-CM | POA: Diagnosis not present

## 2016-05-20 DIAGNOSIS — Z23 Encounter for immunization: Secondary | ICD-10-CM | POA: Diagnosis not present

## 2017-01-23 DIAGNOSIS — E785 Hyperlipidemia, unspecified: Secondary | ICD-10-CM | POA: Diagnosis not present

## 2017-01-23 DIAGNOSIS — R7301 Impaired fasting glucose: Secondary | ICD-10-CM | POA: Diagnosis not present

## 2017-01-23 DIAGNOSIS — Z720 Tobacco use: Secondary | ICD-10-CM | POA: Diagnosis not present

## 2017-01-23 DIAGNOSIS — R252 Cramp and spasm: Secondary | ICD-10-CM | POA: Diagnosis not present

## 2017-01-23 DIAGNOSIS — J45909 Unspecified asthma, uncomplicated: Secondary | ICD-10-CM | POA: Diagnosis not present

## 2017-03-17 ENCOUNTER — Encounter (HOSPITAL_COMMUNITY): Payer: Self-pay | Admitting: *Deleted

## 2017-03-17 ENCOUNTER — Ambulatory Visit (HOSPITAL_COMMUNITY)
Admission: EM | Admit: 2017-03-17 | Discharge: 2017-03-17 | Disposition: A | Discharge status: Home / Self Care | Payer: BLUE CROSS/BLUE SHIELD | Attending: Family Medicine | Admitting: Family Medicine

## 2017-03-17 DIAGNOSIS — M79601 Pain in right arm: Secondary | ICD-10-CM

## 2017-03-17 DIAGNOSIS — S56811A Strain of other muscles, fascia and tendons at forearm level, right arm, initial encounter: Secondary | ICD-10-CM | POA: Diagnosis not present

## 2017-03-17 DIAGNOSIS — R030 Elevated blood-pressure reading, without diagnosis of hypertension: Secondary | ICD-10-CM

## 2017-03-17 MED ORDER — CYCLOBENZAPRINE HCL 10 MG PO TABS: ORAL_TABLET | ORAL | 0 refills | Status: AC

## 2017-03-17 MED ORDER — NAPROXEN 500 MG PO TABS: 500.0000 mg | ORAL_TABLET | Freq: Two times a day (BID) | ORAL | 0 refills | Status: AC | PRN

## 2017-03-17 NOTE — ED Triage Notes (Signed)
Patient reports right arm aching, states that the lid to the ice bin feel on her arm at work last week. States arm aches from forearm to shoulder. No deformity noted or bruising.

## 2017-03-17 NOTE — ED Provider Notes (Signed)
[facility]    CSN: [id number] Arrival date & time: 03/17/17  1003     History   Chief Complaint Chief Complaint  Patient presents with  . Arm Pain    HPI Brenda Armstrong is a 61 y.o. female.   61 year old female presents with right arm pain for the past week. Has become more painful in the past 2 days. Pain started after an ice bin lid fell on the top of her right hand and wrist about 1 week ago at work. Has noticed some swelling but no bruising or deformity. Now entire arm is "sore and achy" from hand to shoulder. Has taken Tylenol with minimal relief. Has also used wrist splint for support but went to work today and was unable to lift heavy items due to shooting pain up arm. Other chronic health issues include asthma and hyperlipidemia. She has an Albuterol inhaler as needed.    The history is provided by the patient.    Past Medical History:  Diagnosis Date  . Asthma     There are no active problems to display for this patient.   History reviewed. No pertinent surgical history.  OB History    No data available       Home Medications    Prior to Admission medications   Medication Sig Start Date End Date Taking? Authorizing Provider  albuterol (PROVENTIL HFA;VENTOLIN HFA) 108 (90 BASE) MCG/ACT inhaler Inhale 2 puffs into the lungs every 6 (six) hours as needed for wheezing or shortness of breath.    [provider]  cyclobenzaprine (FLEXERIL) 10 MG tablet Take 1/2 to 1 tablet by mouth every 8 hours as needed for muscle pain/spams 03/17/17   Kathleen Tamm, Ali Lowe, NP  naproxen (NAPROSYN) 500 MG tablet Take 1 tablet (500 mg total) by mouth 2 (two) times daily as needed for moderate pain. 03/17/17   Sudie Grumbling, NP    Family History Family History  Problem Relation Age of Onset  . Heart disease Sister     Social History Social History  Substance Use Topics  . Smoking status: Former Smoker    Packs/day: 0.10    Years: 3.00    Types:  Cigarettes    Quit date: 07/25/2011  . Smokeless tobacco: Never Used  . Alcohol use Not on file     Allergies   Patient has no known allergies.   Review of Systems Review of Systems  Constitutional: Negative for activity change, appetite change, chills, fatigue and fever.  Respiratory: Negative for chest tightness, shortness of breath and wheezing.   Cardiovascular: Negative for chest pain and palpitations.  Gastrointestinal: Negative for abdominal pain, nausea and vomiting.  Musculoskeletal: Positive for arthralgias and myalgias. Negative for back pain, joint swelling, neck pain and neck stiffness.  Skin: Negative for color change, rash and wound.  Allergic/Immunologic: Negative for immunocompromised state.  Neurological: Negative for dizziness, tremors, seizures, syncope, weakness, light-headedness, numbness and headaches.  Hematological: Negative for adenopathy. Does not bruise/bleed easily.  Psychiatric/Behavioral: Negative.      Physical Exam Triage Vital Signs ED Triage Vitals  Enc Vitals Group     BP 03/17/17 1023 (!) 145/97     Pulse Rate 03/17/17 1023 (!) 59     Resp 03/17/17 1023 16     Temp 03/17/17 1023 98 F (36.7 C)     Temp Source 03/17/17 1023 Oral     SpO2 03/17/17 1023 100 %     Weight --  Height --      Head Circumference --      Peak Flow --      Pain Score 03/17/17 1029 10     Pain Loc --      Pain Edu? --      Excl. in GC? --    No data found.   Updated Vital Signs BP (!) 145/97 (BP Location: Left Arm)   Pulse (!) 59   Temp 98 F (36.7 C) (Oral)   Resp 16   SpO2 100%   Visual Acuity Right Eye Distance:   Left Eye Distance:   Bilateral Distance:    Right Eye Near:   Left Eye Near:    Bilateral Near:     Physical Exam  Constitutional: She is oriented to person, place, and time. She appears well-developed and well-nourished. No distress.  HENT:  Head: Normocephalic and atraumatic.  Right Ear: External ear normal.  Left Ear:  External ear normal.  Eyes: Conjunctivae and EOM are normal.  Neck: Normal range of motion.  Cardiovascular: Normal rate and regular rhythm.   Pulmonary/Chest: Effort normal.  Musculoskeletal: Normal range of motion. She exhibits tenderness. She exhibits no edema.       Right forearm: She exhibits tenderness and swelling. She exhibits no edema, no deformity and no laceration.       Arms: Has full range of motion of hand, arm and shoulder. Tender near radial aspect of wrist and forearm. Slight swelling at 1st metacarpal. No bruising. Good pulses and capillary refill. No neuro deficits noted.   Neurological: She is alert and oriented to person, place, and time. She has normal strength. No sensory deficit.  Skin: Skin is warm and dry. Capillary refill takes less than 2 seconds. No rash noted. No erythema.  Psychiatric: She has a normal mood and affect. Her behavior is normal. Judgment and thought content normal.     UC Treatments / Results  Labs (all labs ordered are listed, but only abnormal results are displayed) Labs Reviewed - No data to display  EKG  EKG Interpretation None       Radiology No results found.  Procedures Procedures (including critical care time)  Medications Ordered in UC Medications - No data to display   Initial Impression / Assessment and Plan / UC Course  I have reviewed the triage vital signs and the nursing notes.  Pertinent labs & imaging results that were available during my care of the patient were reviewed by me and considered in my medical decision making (see chart for details).    Discussed that she probably has a mild muscle strain. Reviewed that imaging is not needed at this time.  Recommend start Naproxen  twice a day as directed. May use Flexeril  1/2 to 1 tablet every 8 hours as needed for muscle pain/spasms- use mainly at night since it causes drowsiness. May apply warm compresses to area for comfort- may alternate with ice as  needed. May wear wrist splint for support during the day if helpful. Note written with lifting restrictions at work for the next 5 days. Also monitor blood pressure- may be elevated today due to pain. Recommend follow-up in 5 to 7 days with an Orthopedic or your previous Neurosurgeon if symptoms persist.   Final Clinical Impressions(s) / UC Diagnoses   Final diagnoses:  Right arm pain  Strain of other muscles, fascia and tendons at forearm level, right arm, initial encounter  Elevated blood-pressure reading without diagnosis of hypertension  New Prescriptions Discharge Medication List as of 03/17/2017 10:56 AM       Controlled Substance Prescriptions  Controlled Substance Registry consulted? Not Applicable   Sudie Grumbling, NP 03/18/17 [phone]

## 2017-03-17 NOTE — Discharge Instructions (Addendum)
Recommend start Naproxen  twice a day as directed. May use Flexeril  1/2 to 1 tablet every 8 hours as needed for muscle pain/spasms- use mainly at night. May apply warm compresses to area for comfort- may alternate with ice as needed. May wear wrist splint for support during the day if helpful. Recommend follow-up in 5 to 7 days with an Orthopedic or your previous Neurosurgeon if symptoms persist.

## 2017-03-26 ENCOUNTER — Encounter (HOSPITAL_COMMUNITY): Payer: Self-pay | Admitting: Emergency Medicine

## 2017-03-26 ENCOUNTER — Ambulatory Visit (INDEPENDENT_AMBULATORY_CARE_PROVIDER_SITE_OTHER): Payer: Worker's Compensation

## 2017-03-26 ENCOUNTER — Ambulatory Visit (HOSPITAL_COMMUNITY)
Admission: EM | Admit: 2017-03-26 | Discharge: 2017-03-26 | Disposition: A | Discharge status: Home / Self Care | Payer: Worker's Compensation | Attending: Family Medicine | Admitting: Family Medicine

## 2017-03-26 DIAGNOSIS — S60221D Contusion of right hand, subsequent encounter: Secondary | ICD-10-CM

## 2017-03-26 DIAGNOSIS — M79641 Pain in right hand: Secondary | ICD-10-CM | POA: Diagnosis not present

## 2017-03-26 DIAGNOSIS — S6991XA Unspecified injury of right wrist, hand and finger(s), initial encounter: Secondary | ICD-10-CM | POA: Diagnosis not present

## 2017-03-26 DIAGNOSIS — M25531 Pain in right wrist: Secondary | ICD-10-CM | POA: Diagnosis not present

## 2017-03-26 MED ORDER — TRAMADOL HCL 50 MG PO TABS: 50.0000 mg | ORAL_TABLET | Freq: Four times a day (QID) | ORAL | 0 refills | Status: AC | PRN

## 2017-03-26 MED ORDER — PREDNISONE 10 MG (21) PO TBPK: ORAL_TABLET | ORAL | 0 refills | Status: AC

## 2017-03-26 NOTE — ED Triage Notes (Signed)
Pt sts right hand and arm pain since something fell on arm last week; pt seen here last week for same

## 2017-03-26 NOTE — Discharge Instructions (Signed)
The reason for your hand pain is not certain. The examination of the hand, wrist and forearm does not find any sort of reason for your pain. As uncertain as to whether this may be a nerve pain or something else. The x-ray is normal. Hand, wrist and forearm function is normal. Take the medication as prescribed. Patient to take the prednisone with food each time you take it. This should help with inflammation which often causes pain. Call the above orthopedist for an appointment tomorrow. It was noticed that Dr. Venetia MaxonStern is a neurosurgeon. Is uncertain as to whether this will be an appropriate referral as of now.

## 2017-03-26 NOTE — ED Provider Notes (Signed)
[facility]    CSN: [id number] Arrival date & time: 03/26/17  1556     History   Chief Complaint Chief Complaint  Patient presents with  . Hand Pain    HPI Brenda Armstrong is a 61 y.o. female.   Approximately 2 weeks ago this 61 year old female had her hand in and I spent in the lid slammed down on top of her hand. She presented to the urgent care with hand pain. There was no evidence of fracture at that time. She was only complaining of pain at the area and which than was struck. She was treated with Naprosyn and wrist splint. Patient states that she is continuing to have pain along the ulnar aspect of the hand and wrist that radiates to the elbow. She states she has not been using her hand, using a sling or keeping her arm near her side. The pain is constant. Nothing seems to make it worse including movement although she prefers to keep her arm in a sling or next to her body. She states the area described above has numbness and a sensation of coldness. To touch it is warm. Overall there is no overt evidence of injury or functional limitation to the right upper extremity.      Past Medical History:  Diagnosis Date  . Asthma     There are no active problems to display for this patient.   History reviewed. No pertinent surgical history.  OB History    No data available       Home Medications    Prior to Admission medications   Medication Sig Start Date End Date Taking? Authorizing Provider  albuterol (PROVENTIL HFA;VENTOLIN HFA) 108 (90 BASE) MCG/ACT inhaler Inhale 2 puffs into the lungs every 6 (six) hours as needed for wheezing or shortness of breath.    [provider]  cyclobenzaprine (FLEXERIL) 10 MG tablet Take 1/2 to 1 tablet by mouth every 8 hours as needed for muscle pain/spams 03/17/17   Amyot, Ali LoweAnn Berry, NP  naproxen (NAPROSYN) 500 MG tablet Take 1 tablet (500 mg total) by mouth 2 (two) times daily as needed for moderate pain. 03/17/17    Sudie GrumblingAmyot, Ann Berry, NP  predniSONE (STERAPRED UNI-PAK 21 TAB) 10 MG (21) TBPK tablet Dispense one 6 day pack. Take as directed with food. 03/26/17   Hayden RasmussenMabe, Amanda Steuart, NP  traMADol (ULTRAM) 50 MG tablet Take 1 tablet (50 mg total) by mouth every 6 (six) hours as needed. 03/26/17   Hayden RasmussenMabe, Tae Vonada, NP    Family History Family History  Problem Relation Age of Onset  . Heart disease Sister     Social History Social History  Substance Use Topics  . Smoking status: Former Smoker    Packs/day: 0.10    Years: 3.00    Types: Cigarettes    Quit date: 07/25/2011  . Smokeless tobacco: Never Used  . Alcohol use Not on file     Allergies   Patient has no known allergies.   Review of Systems Review of Systems  Constitutional: Negative.  Negative for activity change, chills and fever.  HENT: Negative.   Respiratory: Negative.   Cardiovascular: Negative.   Musculoskeletal:       As per HPI  Hand pain along ulnar aspect to include wrist, radiating along the length of the forearm   Skin: Negative for color change, pallor and rash.  Neurological: Negative.      Physical Exam Triage Vital Signs ED Triage Vitals  Enc Vitals  Group     BP 03/26/17 1616 (!) 148/99     Pulse Rate 03/26/17 1615 77     Resp 03/26/17 1615 18     Temp 03/26/17 1615 97.9 F (36.6 C)     Temp Source 03/26/17 1615 Oral     SpO2 03/26/17 1615 98 %     Weight --      Height --      Head Circumference --      Peak Flow --      Pain Score 03/26/17 1616 10     Pain Loc --      Pain Edu? --      Excl. in GC? --    No data found.   Updated Vital Signs BP (!) 148/99 (BP Location: Left Arm)   Pulse 77   Temp 97.9 F (36.6 C) (Oral)   Resp 18   SpO2 98%   Visual Acuity Right Eye Distance:   Left Eye Distance:   Bilateral Distance:    Right Eye Near:   Left Eye Near:    Bilateral Near:     Physical Exam  Constitutional: She is oriented to person, place, and time. She appears well-developed and  well-nourished. No distress.  HENT:  Head: Normocephalic and atraumatic.  Eyes: Pupils are equal, round, and reactive to light. EOM are normal.  Neck: Normal range of motion. Neck supple.  Musculoskeletal: She exhibits no edema, tenderness or deformity.  No swelling or deformity. No observed evidence of injury. Full ROM hand, digits, wrist and forearm. Nl color and warmth. Pulses 2+.   Lymphadenopathy:    She has no cervical adenopathy.  Neurological: She is alert and oriented to person, place, and time. No cranial nerve deficit.  Skin: Skin is warm and dry.     UC Treatments / Results  Labs (all labs ordered are listed, but only abnormal results are displayed) Labs Reviewed - No data to display  EKG  EKG Interpretation None       Radiology Dg Wrist Complete Right  Result Date: 03/26/2017 CLINICAL DATA:  Trauma and pain to the wrist EXAM: RIGHT WRIST - COMPLETE 3+ VIEW COMPARISON:  None. FINDINGS: No definite acute displaced fracture or malalignment. Slightly foreshortened appearance of the scaphoid but no lucency is seen. Suture or foreign material along the volar surface of the proximal hand. IMPRESSION: 1. No definite acute osseous abnormality. 2. Sutures or foreign material along the palmar aspect of the hand. Electronically Signed   By: Jasmine Pang M.D.   On: 03/26/2017 17:10    Procedures Procedures (including critical care time)  Medications Ordered in UC Medications - No data to display   Initial Impression / Assessment and Plan / UC Course  I have reviewed the triage vital signs and the nursing notes.  Pertinent labs & imaging results that were available during my care of the patient were reviewed by me and considered in my medical decision making (see chart for details).    The reason for your hand pain is not certain. The examination of the hand, wrist and forearm does not find any sort of reason for your pain. As uncertain as to whether this may be a nerve  pain or something else. The x-ray is normal. Hand, wrist and forearm function is normal. Take the medication as prescribed. Patient to take the prednisone with food each time you take it. This should help with inflammation which often causes pain. Call the above orthopedist for an  appointment tomorrow. It was noticed that Dr. Venetia Maxon is a neurosurgeon. Is uncertain as to whether this will be an appropriate referral as of now.    Final Clinical Impressions(s) / UC Diagnoses   Final diagnoses:  Contusion of right hand, subsequent encounter  Right hand pain    New Prescriptions New Prescriptions   PREDNISONE (STERAPRED UNI-PAK 21 TAB) 10 MG (21) TBPK TABLET    Dispense one 6 day pack. Take as directed with food.   TRAMADOL (ULTRAM) 50 MG TABLET    Take 1 tablet (50 mg total) by mouth every 6 (six) hours as needed.     Controlled Substance Prescriptions [place] Controlled Substance Registry consulted? Not Applicable   Hayden Rasmussen, NP 03/26/17 1731

## 2017-05-18 DIAGNOSIS — J069 Acute upper respiratory infection, unspecified: Secondary | ICD-10-CM | POA: Diagnosis not present

## 2017-07-27 DIAGNOSIS — R7303 Prediabetes: Secondary | ICD-10-CM | POA: Diagnosis not present

## 2017-07-27 DIAGNOSIS — E785 Hyperlipidemia, unspecified: Secondary | ICD-10-CM | POA: Diagnosis not present

## 2017-07-27 DIAGNOSIS — J45909 Unspecified asthma, uncomplicated: Secondary | ICD-10-CM | POA: Diagnosis not present

## 2017-08-09 ENCOUNTER — Other Ambulatory Visit: Payer: Self-pay

## 2017-08-09 ENCOUNTER — Emergency Department (HOSPITAL_COMMUNITY)
Admission: EM | Admit: 2017-08-09 | Discharge: 2017-08-09 | Disposition: A | Discharge status: Home / Self Care | Payer: BLUE CROSS/BLUE SHIELD | Attending: Emergency Medicine | Admitting: Emergency Medicine

## 2017-08-09 ENCOUNTER — Emergency Department (HOSPITAL_COMMUNITY): Payer: BLUE CROSS/BLUE SHIELD

## 2017-08-09 ENCOUNTER — Encounter (HOSPITAL_COMMUNITY): Payer: Self-pay | Admitting: Emergency Medicine

## 2017-08-09 DIAGNOSIS — H5310 Unspecified subjective visual disturbances: Secondary | ICD-10-CM | POA: Diagnosis not present

## 2017-08-09 DIAGNOSIS — R002 Palpitations: Secondary | ICD-10-CM | POA: Diagnosis not present

## 2017-08-09 DIAGNOSIS — H538 Other visual disturbances: Secondary | ICD-10-CM | POA: Insufficient documentation

## 2017-08-09 DIAGNOSIS — Z79899 Other long term (current) drug therapy: Secondary | ICD-10-CM | POA: Diagnosis not present

## 2017-08-09 DIAGNOSIS — J45909 Unspecified asthma, uncomplicated: Secondary | ICD-10-CM | POA: Diagnosis not present

## 2017-08-09 DIAGNOSIS — H5319 Other subjective visual disturbances: Secondary | ICD-10-CM | POA: Diagnosis not present

## 2017-08-09 DIAGNOSIS — R030 Elevated blood-pressure reading, without diagnosis of hypertension: Secondary | ICD-10-CM | POA: Diagnosis not present

## 2017-08-09 DIAGNOSIS — I499 Cardiac arrhythmia, unspecified: Secondary | ICD-10-CM | POA: Diagnosis not present

## 2017-08-09 DIAGNOSIS — Z87891 Personal history of nicotine dependence: Secondary | ICD-10-CM | POA: Diagnosis not present

## 2017-08-09 LAB — CBC
HCT: 39.6 % (ref 36.0–46.0)
Hemoglobin: 12.6 g/dL (ref 12.0–15.0)
MCH: 27.9 pg (ref 26.0–34.0)
MCHC: 31.8 g/dL (ref 30.0–36.0)
MCV: 87.8 fL (ref 78.0–100.0)
PLATELETS: 230 10*3/uL (ref 150–400)
RBC: 4.51 MIL/uL (ref 3.87–5.11)
RDW: 14.4 % (ref 11.5–15.5)
WBC: 7.4 10*3/uL (ref 4.0–10.5)

## 2017-08-09 LAB — BASIC METABOLIC PANEL
Anion gap: 11 (ref 5–15)
BUN: 11 mg/dL (ref 6–20)
CALCIUM: 9.1 mg/dL (ref 8.9–10.3)
CO2: 22 mmol/L (ref 22–32)
Chloride: 102 mmol/L (ref 101–111)
Creatinine, Ser: 0.73 mg/dL (ref 0.44–1.00)
GFR calc Af Amer: >60 mL/min (ref >60)
GLUCOSE: 92 mg/dL (ref 65–99)
Potassium: 3.6 mmol/L (ref 3.5–5.1)
SODIUM: 135 mmol/L (ref 135–145)

## 2017-08-09 LAB — I-STAT TROPONIN, ED: TROPONIN I, POC: 0 ng/mL (ref 0.00–0.08)

## 2017-08-09 NOTE — ED Notes (Signed)
Pt verbalizes understanding of d/c instructions. Pt ambulatory at d/c with all belongings.   

## 2017-08-09 NOTE — ED Notes (Signed)
ED Provider at bedside. 

## 2017-08-09 NOTE — ED Notes (Signed)
Pt given water. Pt tolerating well. Will continue to monitor.

## 2017-08-09 NOTE — Discharge Instructions (Signed)
The cause of your symptoms is unclear, but I suspect it is probably dehydration due to recent alcohol intake.   Stay well hydrated. Return if you have  persistent blurred vision or loss of vision, severe headache, nausea, vomiting, numbness weakness or heaviness to extremities, difficulty with speech or walking.

## 2017-08-09 NOTE — ED Provider Notes (Addendum)
[facility] EMERGENCY DEPARTMENT Provider Note   CSN: [id number] Arrival date & time: 08/09/17  1255     History   Chief Complaint Chief Complaint  Patient presents with  . Blurred Vision  . Palpitations    HPI Brenda Armstrong is a 62 y.o. female with history of asthma and tobacco abuse is here for evaluation of "flashing lights" to her vision while at work while she was walking out of the bathroom lasting approximately 20 minutes, spontaneously resolved. Describes it as "streams of lights fading in and out". Associated with getting really sweaty and hot. A coworker near her told her to sit down and called EMS. EMS told patient her blood pressure was in the 200s. She does not have history of high blood pressure. States that she drank at least 10 shots of tequila last night. Today she had a [facility] and less than 1 water bottle. She denies associated headache nausea, vomiting, chest pain, shortness of breath, palpitations. Has been otherwise feeling at baseline and now is denying any symptoms. States she drinks 1-2 times a week but when she drinks she drinks "a lot". Last drink 9pm last night. Denies problems with alcohol or dependence, denies withdrawal symptoms. Vision is back at baseline.   HPI  Past Medical History:  Diagnosis Date  . Asthma     There are no active problems to display for this patient.   History reviewed. No pertinent surgical history.  OB History    No data available       Home Medications    Prior to Admission medications   Medication Sig Start Date End Date Taking? Authorizing Provider  albuterol (PROVENTIL HFA;VENTOLIN HFA) 108 (90 BASE) MCG/ACT inhaler Inhale 2 puffs into the lungs every 6 (six) hours as needed for wheezing or shortness of breath.   Yes [provider]  BREO ELLIPTA 200-25 MCG/INH AEPB Inhale 1 puff into the lungs as needed. 07/27/17  Yes [provider]  cholecalciferol (VITAMIN D) 1000  units tablet Take 1,000 Units by mouth daily.   Yes [provider]  cyanocobalamin 500 MCG tablet Take 500 mcg by mouth daily.   Yes [provider]  ferrous sulfate 325 (65 FE) MG EC tablet Take 325 mg by mouth 3 (three) times daily with meals.   Yes [provider]  Potassium (POTASSIMIN PO) Take 1 tablet by mouth daily.   Yes [provider]  vitamin C (ASCORBIC ACID) 500 MG tablet Take 500 mg by mouth daily.   Yes [provider]  cyclobenzaprine (FLEXERIL) 10 MG tablet Take 1/2 to 1 tablet by mouth every 8 hours as needed for muscle pain/spams 03/17/17   Amyot, Ali Lowe, NP  naproxen (NAPROSYN) 500 MG tablet Take 1 tablet (500 mg total) by mouth 2 (two) times daily as needed for moderate pain. Patient not taking: Reported on 08/09/2017 03/17/17   Sudie Grumbling, NP  predniSONE (STERAPRED UNI-PAK 21 TAB) 10 MG (21) TBPK tablet Dispense one 6 day pack. Take as directed with food. Patient not taking: Reported on 08/09/2017 03/26/17   Hayden Rasmussen, NP  traMADol (ULTRAM) 50 MG tablet Take 1 tablet (50 mg total) by mouth every 6 (six) hours as needed. Patient not taking: Reported on 08/09/2017 03/26/17   Hayden Rasmussen, NP    Family History Family History  Problem Relation Age of Onset  . Heart disease Sister     Social History Social History   Tobacco Use  .  Smoking status: Former Smoker    Packs/day: 0.10    Years: 3.00    Pack years: 0.30    Types: Cigarettes    Last attempt to quit: 07/25/2011    Years since quitting: 6.0  . Smokeless tobacco: Never Used  Substance Use Topics  . Alcohol use: Not on file  . Drug use: Not on file     Allergies   Wheat bran   Review of Systems Review of Systems  Eyes: Positive for visual disturbance (resolved).  All other systems reviewed and are negative.    Physical Exam Updated Vital Signs BP (!) 152/97   Pulse 64   Temp 98.4 F (36.9 C) (Oral)   Resp 20   Ht 5\' 3"  (1.6 m)   Wt 70.8  kg (156 lb)   SpO2 100%   BMI 27.63 kg/m   Physical Exam  Constitutional: She is oriented to person, place, and time. She appears well-developed and well-nourished. No distress.  Non toxic  HENT:  Head: Normocephalic and atraumatic.  Nose: Nose normal.  Mouth/Throat: No oropharyngeal exudate.  Moist mucous membranes. Dry lips.   Eyes: Conjunctivae are normal.  EOMs and PERRL intact bil  Neck: Normal range of motion.  Cardiovascular: Normal rate, regular rhythm and intact distal pulses.  No murmur heard. 2+ DP and radial pulses bilaterally. No LE edema.   Pulmonary/Chest: Effort normal and breath sounds normal. No respiratory distress. She has no wheezes. She has no rales.  Abdominal: Soft. Bowel sounds are normal. There is no tenderness.  No G/R/R. No suprapubic or CVA tenderness.   Musculoskeletal: Normal range of motion. She exhibits no deformity.  Neurological: She is alert and oriented to person, place, and time.  Alert and oriented to self, place, time and event.  Speech is fluent without obvious dysarthria or dysphasia. Strength 5/5 with hand grip and ankle F/E.   Sensation to light touch intact in hands and feet. Normal gait. Good tandem walk. No pronator drift. No leg drop.  Normal finger-to-nose and finger tapping.  CN I and VIII not tested. CN II-XII grossly intact bilaterally.  Knee and brachioradialis DTR symmetric. No ankle clonus.   Skin: Skin is warm and dry. Capillary refill takes less than 2 seconds.  Psychiatric: She has a normal mood and affect. Her behavior is normal. Judgment and thought content normal.  Nursing note and vitals reviewed.    ED Treatments / Results  Labs (all labs ordered are listed, but only abnormal results are displayed) Labs Reviewed  BASIC METABOLIC PANEL  CBC  I-STAT TROPONIN, ED    EKG  EKG Interpretation  Date/Time:  Sunday August 09 2017 13:32:10 EDT Ventricular Rate:  85 PR Interval:  142 QRS Duration: 86 QT  Interval:  388 QTC Calculation: 461 R Axis:   -14 Text Interpretation:  Normal sinus rhythm Possible Left atrial enlargement T wave abnormality, consider anterior ischemia Abnormal ECG No significant change was found Confirmed by Azalia Bilisampos, Kevin ([id number]) on 08/09/2017 6:56:52 PM       Radiology Dg Chest 2 View  Result Date: 08/09/2017 CLINICAL DATA:  Blurred vision, irregular heartbeat EXAM: CHEST - 2 VIEW COMPARISON:  Chest x-ray dated 05/28/2015 FINDINGS: Heart size and mediastinal contours are within normal limits. Lungs are hyperexpanded. Coarse lung markings again noted bilaterally suggesting some degree of chronic interstitial lung disease/fibrosis, stable. Lungs otherwise clear. No confluent opacity to suggest a developing pneumonia. No pleural effusion or pneumothorax seen. No acute or suspicious osseous finding. IMPRESSION:  1. No active cardiopulmonary disease. No evidence of pneumonia or pulmonary edema. 2. Hyperexpanded lungs suggesting COPD. Probable associated chronic interstitial lung disease/fibrosis, stable. Electronically Signed   By: Bary Richard M.D.   On: 08/09/2017 14:00    Procedures Procedures (including critical care time)  Medications Ordered in ED Medications - No data to display   Initial Impression / Assessment and Plan / ED Course  I have reviewed the triage vital signs and the nursing notes.  Pertinent labs & imaging results that were available during my care of the patient were reviewed by me and considered in my medical decision making (see chart for details).     63 year old is here for visual disturbances, spontaneously resolved when she sat down. Associated with feeling hot. Admits to drinking large amounts of tequila last night and poor fluid intake today. Exam, as above is unremarkable. Neuro exam intact. Vital signs within normal limits and stable throughout the ED. She was on the cardiac monitor without any acute changes. Screening labs, troponin, chest  x-ray, EKG reviewed and are okay. Given history and symptomatology, likely vasovagal episode from mild dehydration. She has dry lips but does not look severely dehydrated. She was hydrated orally in the ED. She has no red flags including severe headache, persistent vision changes, nausea, vomiting, chest pain, palpitations or unilateral focal neural deficits. Her vision is back at baseline. At this time, I don't think there is further indication for emergent lab work or imaging. She will be discharged at this time. Discussed risks of heavy EtOH use.Encouraged her to stay hydrated and follow-up with PCP in 2-3 days if symptoms return. Discussed return precautions.  Final Clinical Impressions(s) / ED Diagnoses   Final diagnoses:  Flashing lights  Subjective visual disturbances    ED Discharge Orders    None         Jerrell Mylar 08/09/17 Harlan Stains, MD 08/10/17 1209

## 2017-08-09 NOTE — ED Triage Notes (Signed)
Pt. Stated, I ws at my job and I started having blurred vision and I sit down and they called EMS and they sad I had irregular heart beat.  Right now I feel fine.

## 2017-08-13 DIAGNOSIS — R030 Elevated blood-pressure reading, without diagnosis of hypertension: Secondary | ICD-10-CM | POA: Diagnosis not present

## 2017-08-13 DIAGNOSIS — G479 Sleep disorder, unspecified: Secondary | ICD-10-CM | POA: Diagnosis not present

## 2018-01-26 DIAGNOSIS — R7303 Prediabetes: Secondary | ICD-10-CM | POA: Diagnosis not present

## 2018-01-26 DIAGNOSIS — J45909 Unspecified asthma, uncomplicated: Secondary | ICD-10-CM | POA: Diagnosis not present

## 2018-01-26 DIAGNOSIS — Z720 Tobacco use: Secondary | ICD-10-CM | POA: Diagnosis not present

## 2018-01-26 DIAGNOSIS — E785 Hyperlipidemia, unspecified: Secondary | ICD-10-CM | POA: Diagnosis not present

## 2018-01-26 DIAGNOSIS — Z23 Encounter for immunization: Secondary | ICD-10-CM | POA: Diagnosis not present

## 2018-04-27 DIAGNOSIS — E785 Hyperlipidemia, unspecified: Secondary | ICD-10-CM | POA: Diagnosis not present

## 2019-07-22 ENCOUNTER — Other Ambulatory Visit: Payer: Self-pay | Admitting: Physician Assistant

## 2019-07-22 DIAGNOSIS — Z13820 Encounter for screening for osteoporosis: Secondary | ICD-10-CM

## 2019-08-04 ENCOUNTER — Other Ambulatory Visit: Payer: Self-pay | Admitting: Physician Assistant

## 2019-08-04 DIAGNOSIS — Z1231 Encounter for screening mammogram for malignant neoplasm of breast: Secondary | ICD-10-CM

## 2019-08-17 ENCOUNTER — Ambulatory Visit
Admission: RE | Admit: 2019-08-17 | Discharge: 2019-08-17 | Disposition: A | Discharge status: Home / Self Care | Payer: 59 | Source: Ambulatory Visit | Attending: Physician Assistant | Admitting: Physician Assistant

## 2019-08-17 ENCOUNTER — Other Ambulatory Visit: Payer: Self-pay

## 2019-08-17 DIAGNOSIS — Z1231 Encounter for screening mammogram for malignant neoplasm of breast: Secondary | ICD-10-CM

## 2019-08-18 ENCOUNTER — Other Ambulatory Visit: Payer: Self-pay

## 2019-08-18 ENCOUNTER — Ambulatory Visit (INDEPENDENT_AMBULATORY_CARE_PROVIDER_SITE_OTHER): Payer: 59

## 2019-08-18 ENCOUNTER — Ambulatory Visit (INDEPENDENT_AMBULATORY_CARE_PROVIDER_SITE_OTHER): Payer: 59 | Admitting: Orthopedic Surgery

## 2019-08-18 DIAGNOSIS — M25561 Pain in right knee: Secondary | ICD-10-CM | POA: Diagnosis not present

## 2019-08-18 DIAGNOSIS — M25461 Effusion, right knee: Secondary | ICD-10-CM

## 2019-08-18 DIAGNOSIS — M1711 Unilateral primary osteoarthritis, right knee: Secondary | ICD-10-CM

## 2019-08-19 ENCOUNTER — Encounter: Payer: Self-pay | Admitting: Orthopedic Surgery

## 2019-08-19 DIAGNOSIS — M25461 Effusion, right knee: Secondary | ICD-10-CM

## 2019-08-19 LAB — SYNOVIAL CELL COUNT + DIFF, W/ CRYSTALS
Basophils, %: 0 %
Eosinophils-Synovial: 0 % (ref 0–2)
Lymphocytes-Synovial Fld: 56 % (ref 0–74)
Monocyte/Macrophage: 24 % (ref 0–69)
Neutrophil, Synovial: 8 % (ref 0–24)
Synoviocytes, %: 4 % (ref 0–15)
WBC, Synovial: 157 cells/uL — ABNORMAL HIGH (ref <150)

## 2019-08-19 LAB — TIQ-NTM

## 2019-08-19 MED ORDER — LIDOCAINE HCL 1 % IJ SOLN
5.0000 mL | INTRAMUSCULAR | Status: AC | PRN
  Administered 2019-08-19: 5 mL

## 2019-08-19 MED ORDER — METHYLPREDNISOLONE ACETATE 40 MG/ML IJ SUSP
40.0000 mg | INTRAMUSCULAR | Status: AC | PRN
  Administered 2019-08-19: 40 mg via INTRA_ARTICULAR

## 2019-08-19 MED ORDER — BUPIVACAINE HCL 0.25 % IJ SOLN
4.0000 mL | INTRAMUSCULAR | Status: AC | PRN
  Administered 2019-08-19: 4 mL via INTRA_ARTICULAR

## 2019-08-19 NOTE — Progress Notes (Signed)
Office Visit Note   Patient: Brenda Armstrong           Date of Birth: 1955/07/19           MRN: [id number] Visit Date: 08/18/2019 Requested by: Clementeen Graham, PA-C [address] [place],  [place] [address] PCP: Scifres, Peter Minium  Subjective: Chief Complaint  Patient presents with  . Right Knee - Pain    HPI: Brenda Armstrong is a 64 y.o. female who presents to the office complaining of right knee pain and swelling.  Patient notes pain in the right knee over the last month.  She denies any injury.  1 month ago she went to bed feeling fine with no knee pain and woke up the next day with a swollen painful right knee.  She had no injury that she can recall.  Pain is improving slowly but steadily.  Right knee has been giving way on her but denies any mechanical symptoms.  She denies any groin pain, low back pain, numbness/tingling aside from some numbness in the third and fourth toes of the right foot.  She has tried ice/heat/brace as well as Aleve and aspirin with some relief from pain.  She also took a steroid Dosepak that she received from her PCP that helped for a time but did not fully resolve her pain.  She has no history of surgery on the right knee and denies any history of gout..                ROS:  All systems reviewed are negative as they relate to the chief complaint within the history of present illness.  Patient denies fevers or chills.  Assessment & Plan: Visit Diagnoses:  1. Effusion, right knee   2. Right knee pain, unspecified chronicity   3. Unilateral primary osteoarthritis, right knee     Plan: Patient is a 64 year old female who presents complaining of right knee pain.  She had sudden onset of right knee pain and swelling overnight without injury.  She does have degenerative changes noted throughout the medial, lateral, patellofemoral compartments on radiographs today but these changes are not severe.  Differential diagnosis includes acute on chronic  right knee pain secondary to osteoarthritis versus gout versus pseudogout versus degenerative meniscal tear..  She has an effusion on exam.  Discussed options available to patient.  After lengthy discussion patient opted for a right knee aspiration and injection with cortisone and follow-up in 4 weeks.  She tolerated the procedure well and 7 cc were aspirated from the right knee.  We will send this aspirate fluid for cell count and crystal analysis.  Negative for crystals at the time of this dictation.  Patient will follow up in 4 weeks for clinical recheck with determination on whether or not to order an MRI at that time.  Patient agreed with this plan.  Follow-Up Instructions: No follow-ups on file.   Orders:  Orders Placed This Encounter  Procedures  . XR KNEE 3 VIEW RIGHT  . Cell count + diff,  w/ cryst-synvl fld  . TIQ-NTM   No orders of the defined types were placed in this encounter.     Procedures: Large Joint Inj: R knee on 08/19/2019 6:30 PM Indications: diagnostic evaluation, joint swelling and pain Details: 18 G 1.5 in needle, superolateral approach  Arthrogram: No  Medications: 5 mL lidocaine 1 %; 40 mg methylPREDNISolone acetate 40 MG/ML; 4 mL bupivacaine 0.25 % Aspirate: 7 mL serous; sent for lab  analysis Outcome: tolerated well, no immediate complications Procedure, treatment alternatives, risks and benefits explained, specific risks discussed. Consent was given by the patient. Immediately prior to procedure a time out was called to verify the correct patient, procedure, equipment, support staff and site/side marked as required. Patient was prepped and draped in the usual sterile fashion.       Clinical Data: No additional findings.  Objective: Vital Signs: There were no vitals taken for this visit.  Physical Exam:  Constitutional: Patient appears well-developed HEENT:  Head: Normocephalic Eyes:EOM are normal Neck: Normal range of motion Cardiovascular:  Normal rate Pulmonary/chest: Effort normal Neurologic: Patient is alert Skin: Skin is warm Psychiatric: Patient has normal mood and affect  Ortho Exam:  Right knee Exam Positive effusion Tender to palpation over the medial and lateral joint lines Extensor mechanism intact No TTP over the quad tendon, patellar tendon, pes anserinus, patella, tibial tubercle, LCL/MCL insertions Stable to varus/valgus stresses.  Stable to anterior/posterior drawer Extension to 0 degrees Flexion > 90 degrees  Specialty Comments:  No specialty comments available.  Imaging: No results found.   PMFS History: There are no problems to display for this patient.  Past Medical History:  Diagnosis Date  . Asthma     Family History  Problem Relation Age of Onset  . Heart disease Sister     No past surgical history on file. Social History   Occupational History  . Not on file  Tobacco Use  . Smoking status: Former Smoker    Packs/day: 0.10    Years: 3.00    Pack years: 0.30    Types: Cigarettes    Quit date: 07/25/2011    Years since quitting: 8.0  . Smokeless tobacco: Never Used  Substance and Sexual Activity  . Alcohol use: Not on file  . Drug use: Not on file  . Sexual activity: Not on file

## 2019-08-22 NOTE — Progress Notes (Signed)
faxed

## 2019-08-23 ENCOUNTER — Other Ambulatory Visit: Payer: Self-pay | Admitting: Physician Assistant

## 2019-08-23 DIAGNOSIS — R928 Other abnormal and inconclusive findings on diagnostic imaging of breast: Secondary | ICD-10-CM

## 2019-09-06 ENCOUNTER — Ambulatory Visit
Admission: RE | Admit: 2019-09-06 | Discharge: 2019-09-06 | Disposition: A | Discharge status: Home / Self Care | Payer: 59 | Source: Ambulatory Visit | Attending: Physician Assistant | Admitting: Physician Assistant

## 2019-09-06 ENCOUNTER — Other Ambulatory Visit: Payer: Self-pay | Admitting: Physician Assistant

## 2019-09-06 ENCOUNTER — Other Ambulatory Visit: Payer: Self-pay

## 2019-09-06 DIAGNOSIS — R928 Other abnormal and inconclusive findings on diagnostic imaging of breast: Secondary | ICD-10-CM

## 2019-09-06 DIAGNOSIS — R599 Enlarged lymph nodes, unspecified: Secondary | ICD-10-CM

## 2019-09-09 ENCOUNTER — Other Ambulatory Visit: Payer: Self-pay

## 2019-09-09 ENCOUNTER — Ambulatory Visit
Admission: RE | Admit: 2019-09-09 | Discharge: 2019-09-09 | Disposition: A | Discharge status: Home / Self Care | Payer: 59 | Source: Ambulatory Visit | Attending: Physician Assistant | Admitting: Physician Assistant

## 2019-09-09 DIAGNOSIS — Z13820 Encounter for screening for osteoporosis: Secondary | ICD-10-CM

## 2019-09-16 ENCOUNTER — Ambulatory Visit
Admission: RE | Admit: 2019-09-16 | Discharge: 2019-09-16 | Disposition: A | Discharge status: Home / Self Care | Payer: 59 | Source: Ambulatory Visit | Attending: Physician Assistant | Admitting: Physician Assistant

## 2019-09-16 ENCOUNTER — Other Ambulatory Visit: Payer: Self-pay

## 2019-09-16 DIAGNOSIS — R599 Enlarged lymph nodes, unspecified: Secondary | ICD-10-CM

## 2019-09-20 LAB — SURGICAL PATHOLOGY

## 2020-08-31 IMAGING — MG DIGITAL SCREENING BILAT W/ TOMO W/ CAD
8 series · 8 of 24 positions shown · non-contrast
Comparison: None.

CLINICAL DATA: Screening.

EXAM:
DIGITAL SCREENING BILATERAL MAMMOGRAM WITH TOMO AND CAD

[L MLO synth-2D]
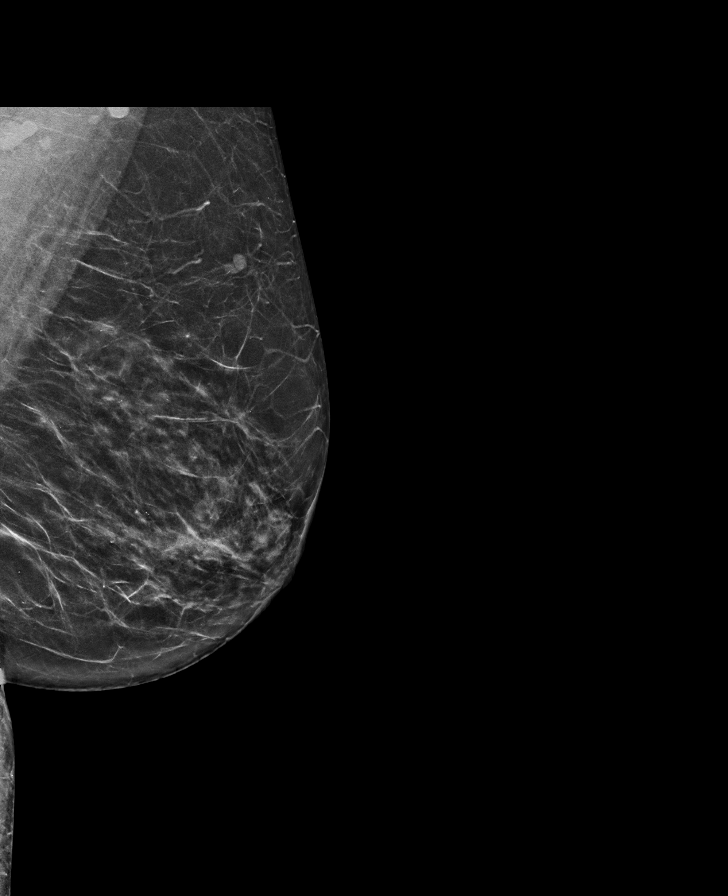

[R CC synth-2D]
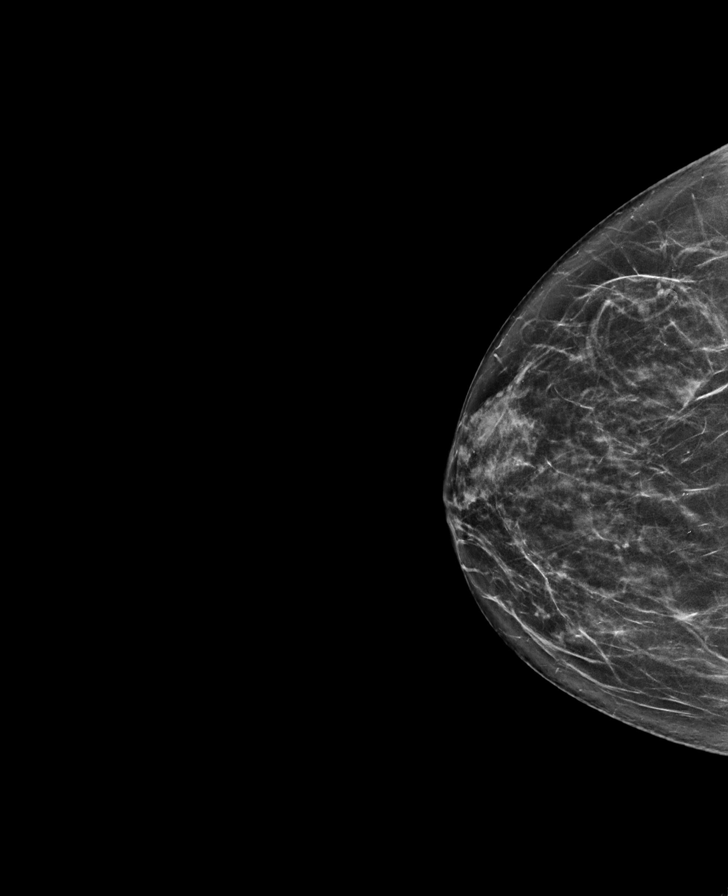

[L CC synth-2D]
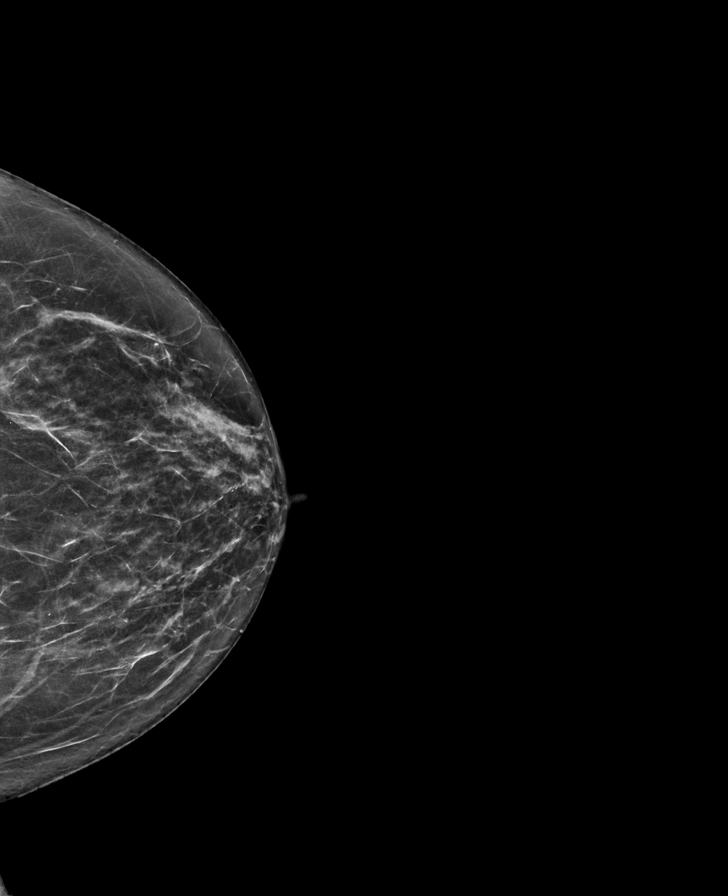

[R MLO synth-2D]
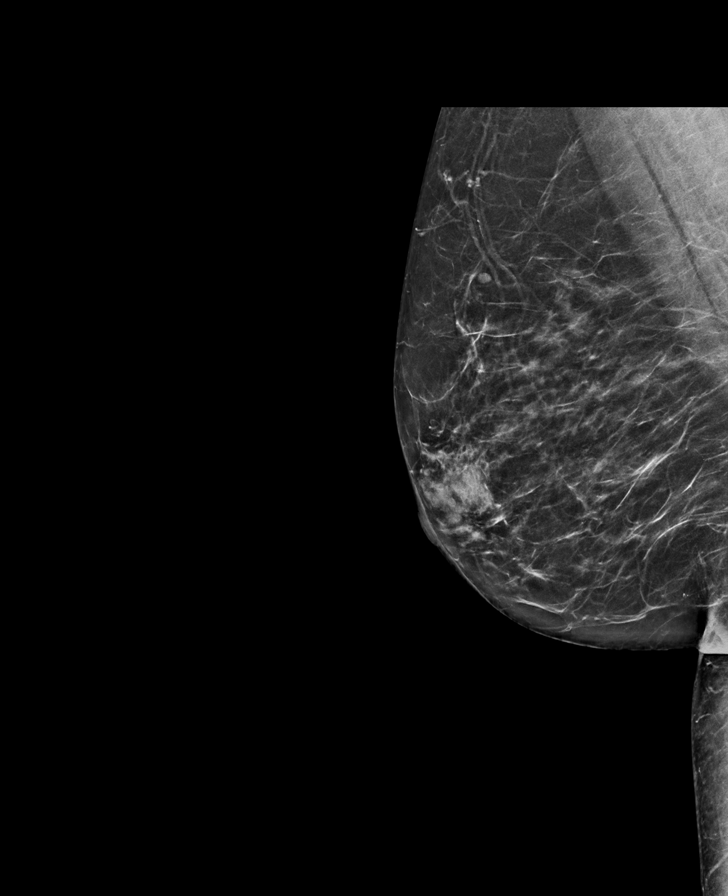

[L CC tomo · tomo slice 31/62.0]
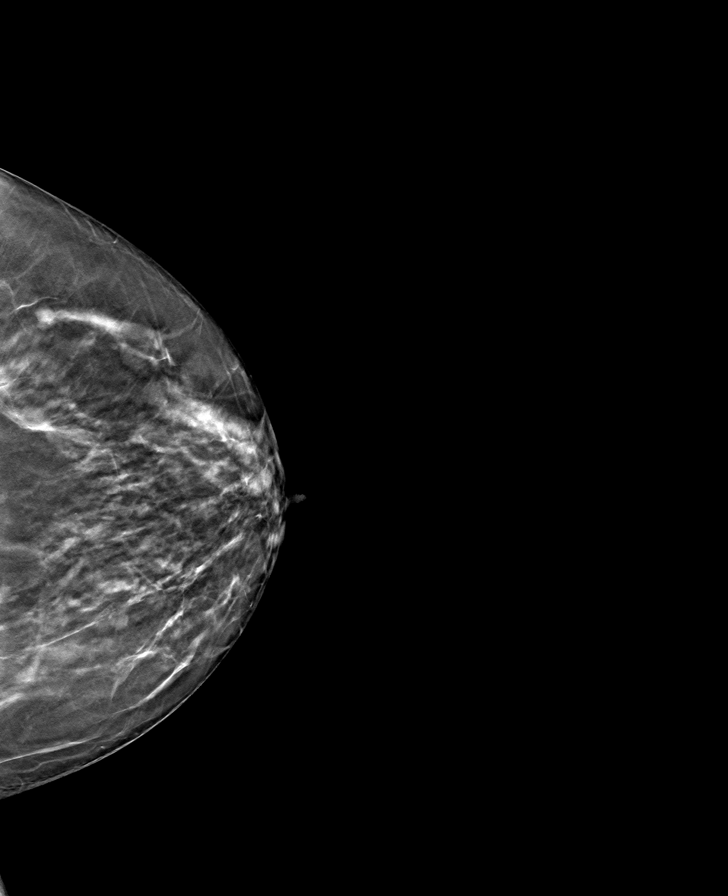

[L MLO tomo · tomo slice 33/66.0]
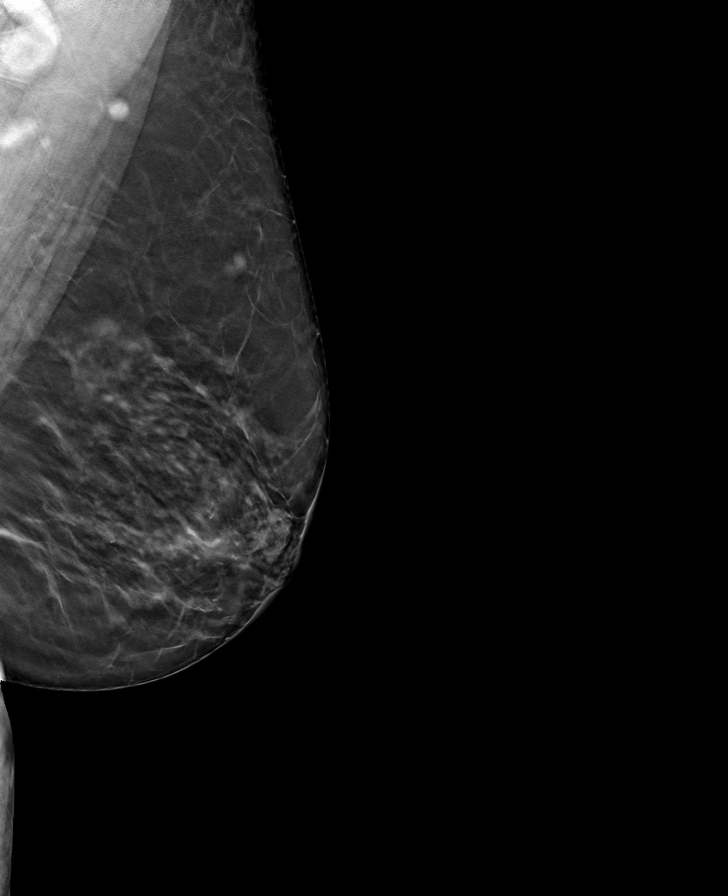

[R CC tomo · tomo slice 33/64.0]
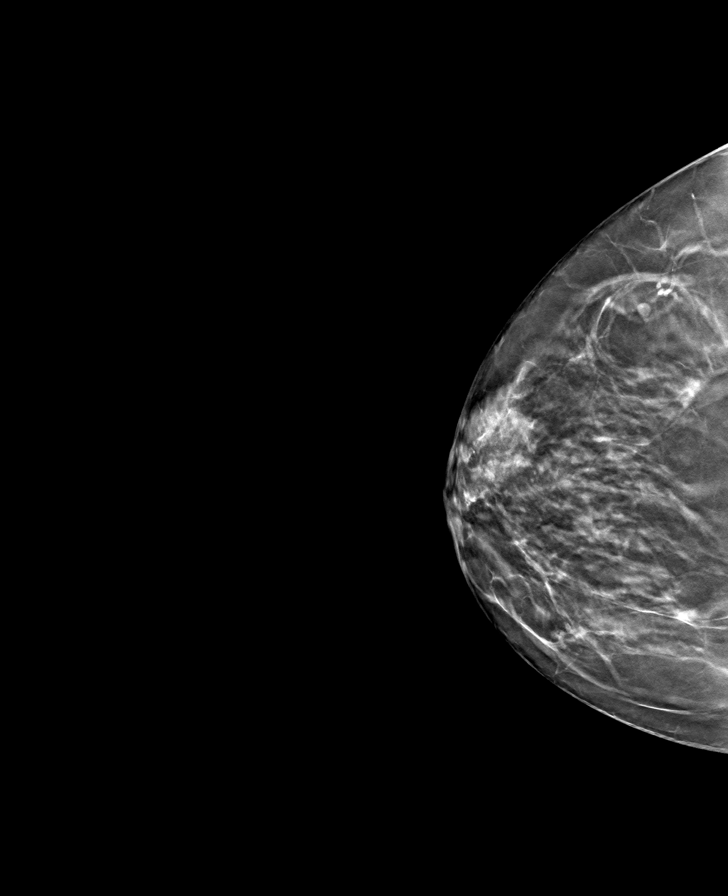

[R MLO tomo · tomo slice 33/65.0]
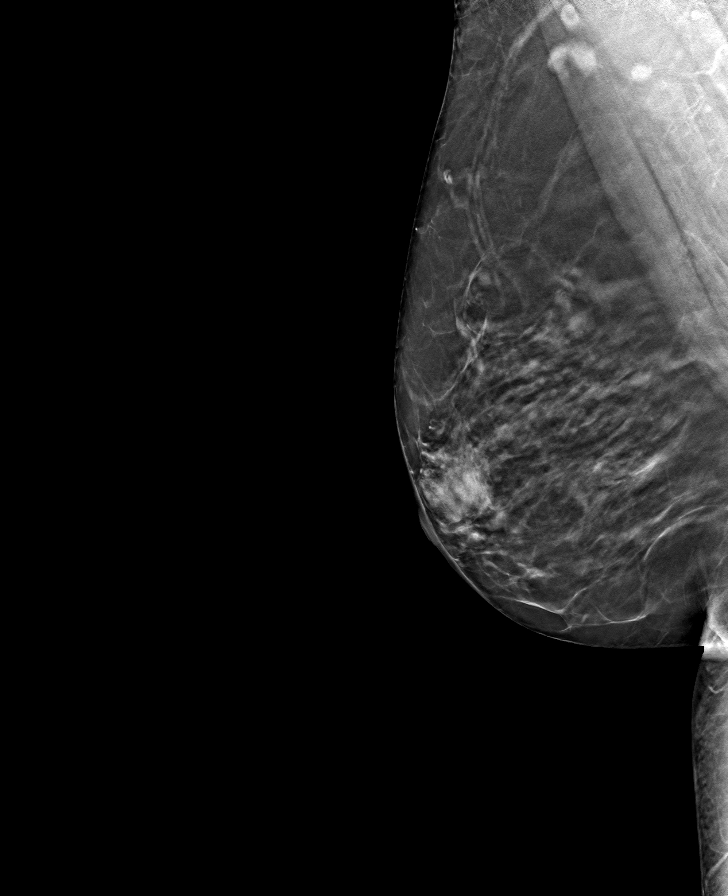

[8 of 24 positions shown; findings below may reference images not displayed]

ACR Breast Density Category b: There are scattered areas of
fibroglandular density.
FINDINGS: In the left breast, a possible mass warrants further evaluation. In
the right breast, no findings suspicious for malignancy.

Images were processed with CAD.
IMPRESSION: Further evaluation is suggested for possible mass in the left
breast.

RECOMMENDATION:
Diagnostic mammogram and possibly ultrasound of the left breast.
(Code:[id number]-[id number])

The patient will be contacted regarding the findings, and additional
imaging will be scheduled.

BI-RADS CATEGORY  0: Incomplete. Need additional imaging evaluation
and/or prior mammograms for comparison.

## 2020-09-20 IMAGING — US US AXILLARY LEFT
1 series · 7 of 7 positions shown · non-contrast
Comparison: Screening mammography August 17, 2019

CLINICAL DATA: The patient was called back from screening
mammography due to an abnormal left axillary node. Her [id number] shot
was after the screening mammogram.

EXAM:
DIGITAL DIAGNOSTIC LEFT MAMMOGRAM WITH TOMO
ULTRASOUND LEFT BREAST

[Series 1: us axillary left · 0.07mm/px · 7 of 7 slices shown]
[im 1/7]
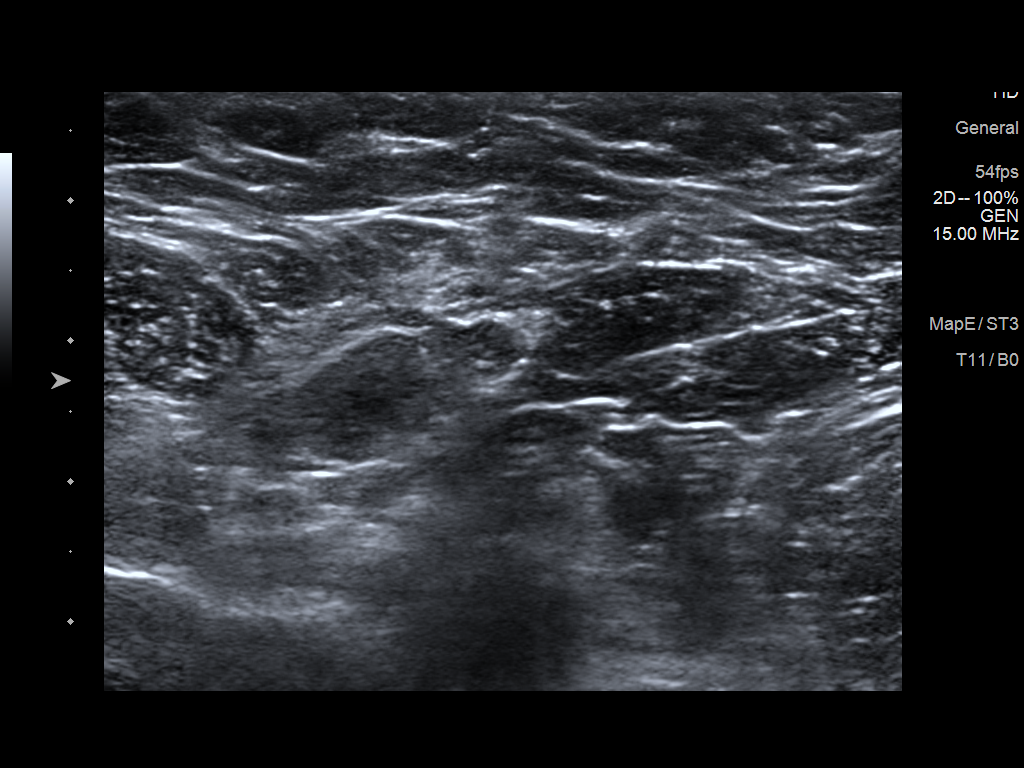
[im 2/7]
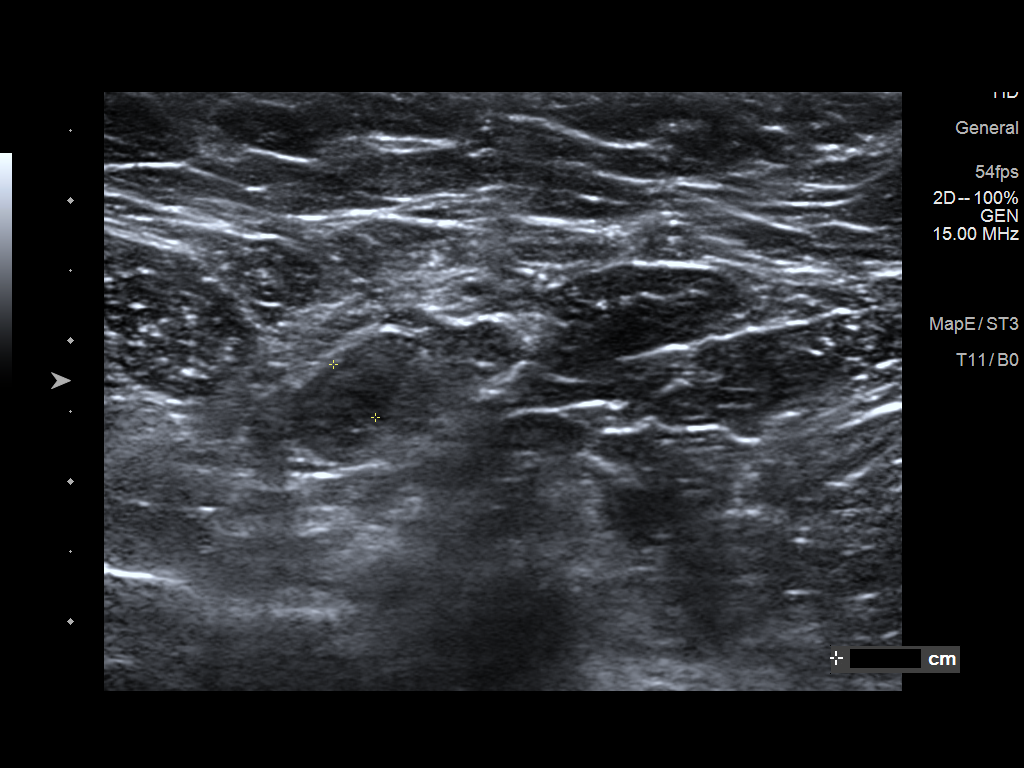
[im 3/7]
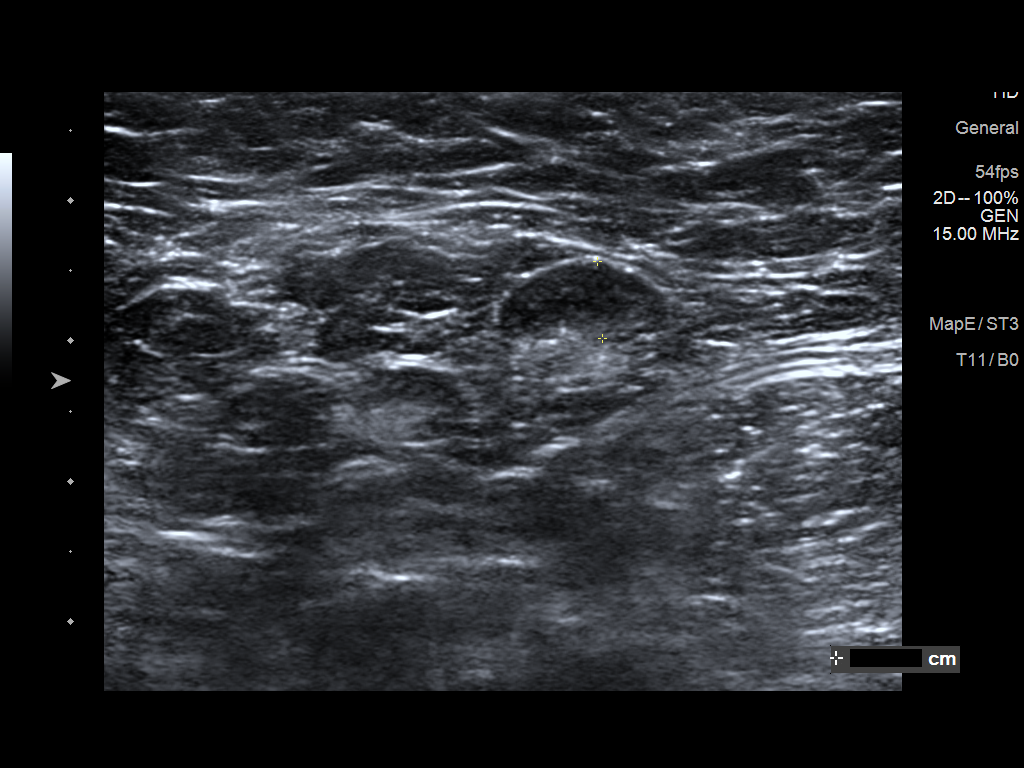
[im 4/7]
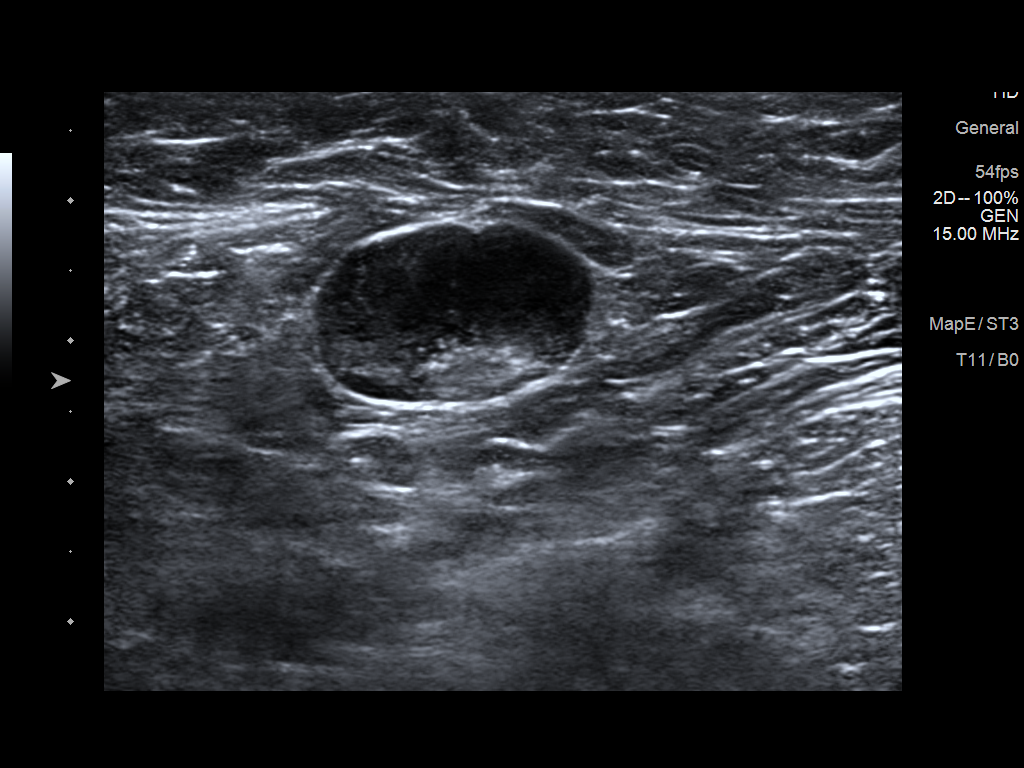
[im 5/7]
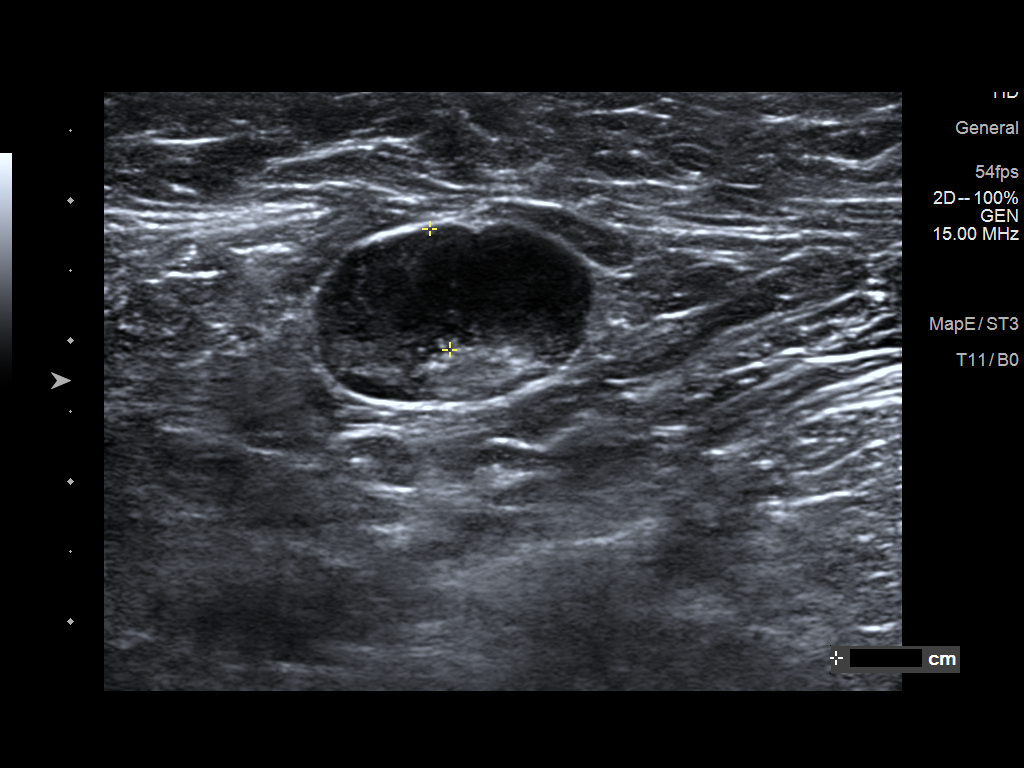
[im 6/7]
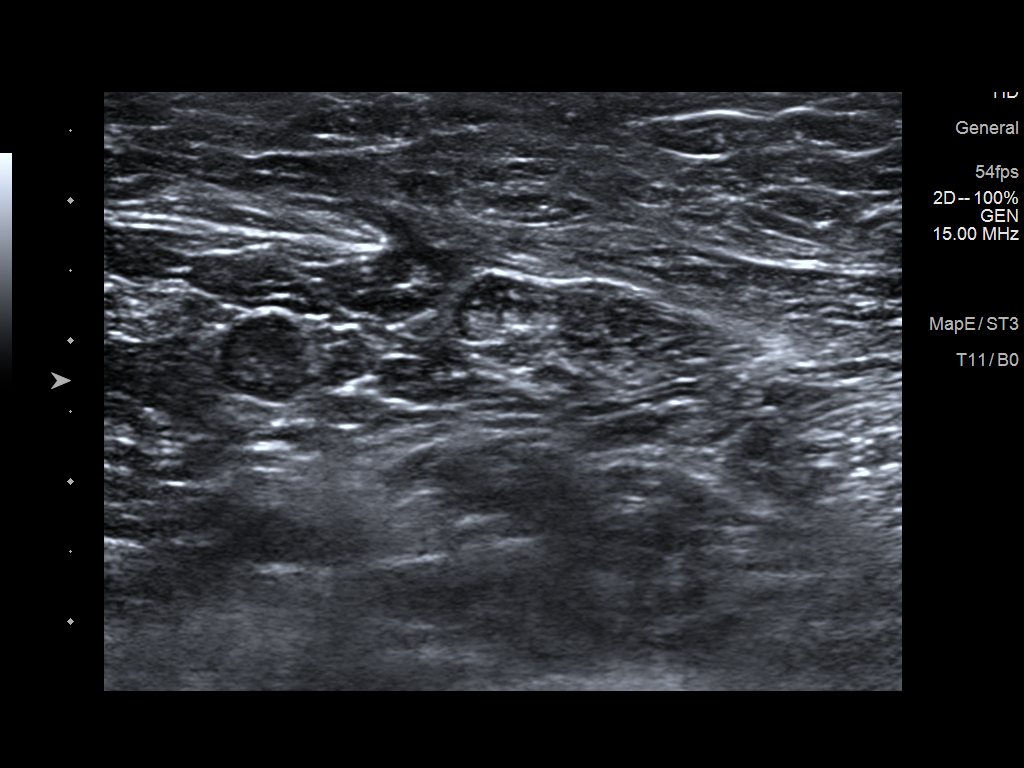
[im 7/7]
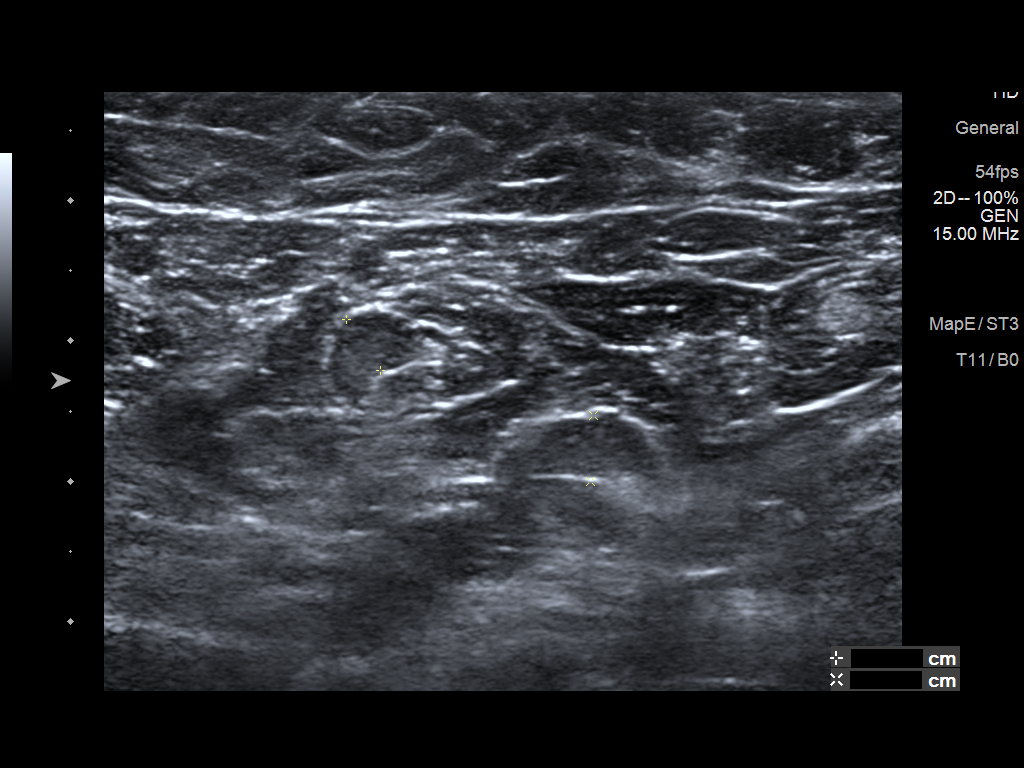

[7 of 7 positions shown; findings below may reference images not displayed]

ACR Breast Density Category b: There are scattered areas of
fibroglandular density.
FINDINGS: There are 1 or 2 abnormal lymph nodes in the left axilla.

On physical exam, no suspicious lumps are identified.

Targeted ultrasound is performed, showing 1 dominant abnormal lymph
node in the left axilla with a cortex measuring nearly 9 mm. Three
other more mildly abnormal nodes are seen as well. Two mildly
abnormal nodes are seen on the right which was imaged for comparison
with cortices measuring between 4 and 5 mm.
IMPRESSION: Lymphadenopathy, left greater than right.

RECOMMENDATION:
Recommend ultrasound-guided biopsy of the dominant abnormal left
axillary lymph node.

I have discussed the findings and recommendations with the patient.
If applicable, a reminder letter will be sent to the patient
regarding the next appointment.

BI-RADS CATEGORY  4: Suspicious.

## 2020-09-20 IMAGING — MG MM DIGITAL DIAGNOSTIC UNILAT*L* W/ TOMO W/ CAD
2 series · 3 of 6 positions shown · non-contrast
Comparison: Screening mammography August 17, 2019

CLINICAL DATA: The patient was called back from screening
mammography due to an abnormal left axillary node. Her [id number] shot
was after the screening mammogram.

EXAM:
DIGITAL DIAGNOSTIC LEFT MAMMOGRAM WITH TOMO
ULTRASOUND LEFT BREAST

[L MLO synth-2D]
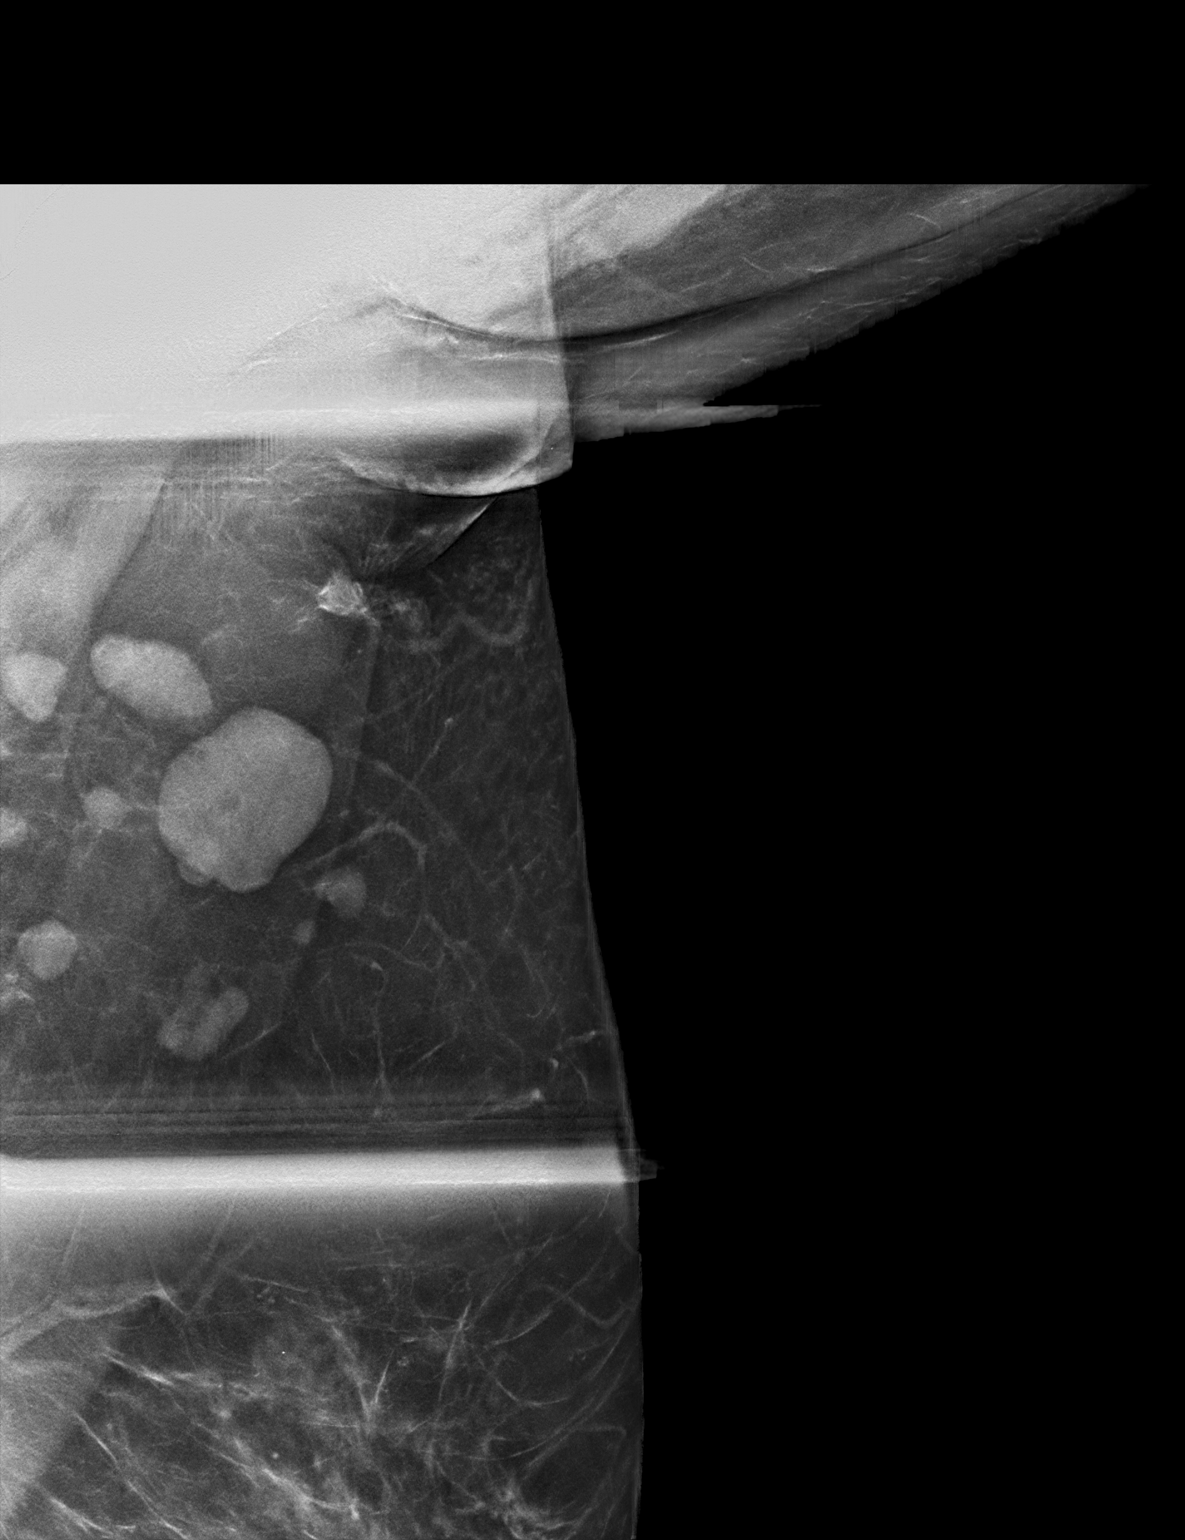

[L MLO tomo · 2 of 79 frames shown]
[frame 26/79]
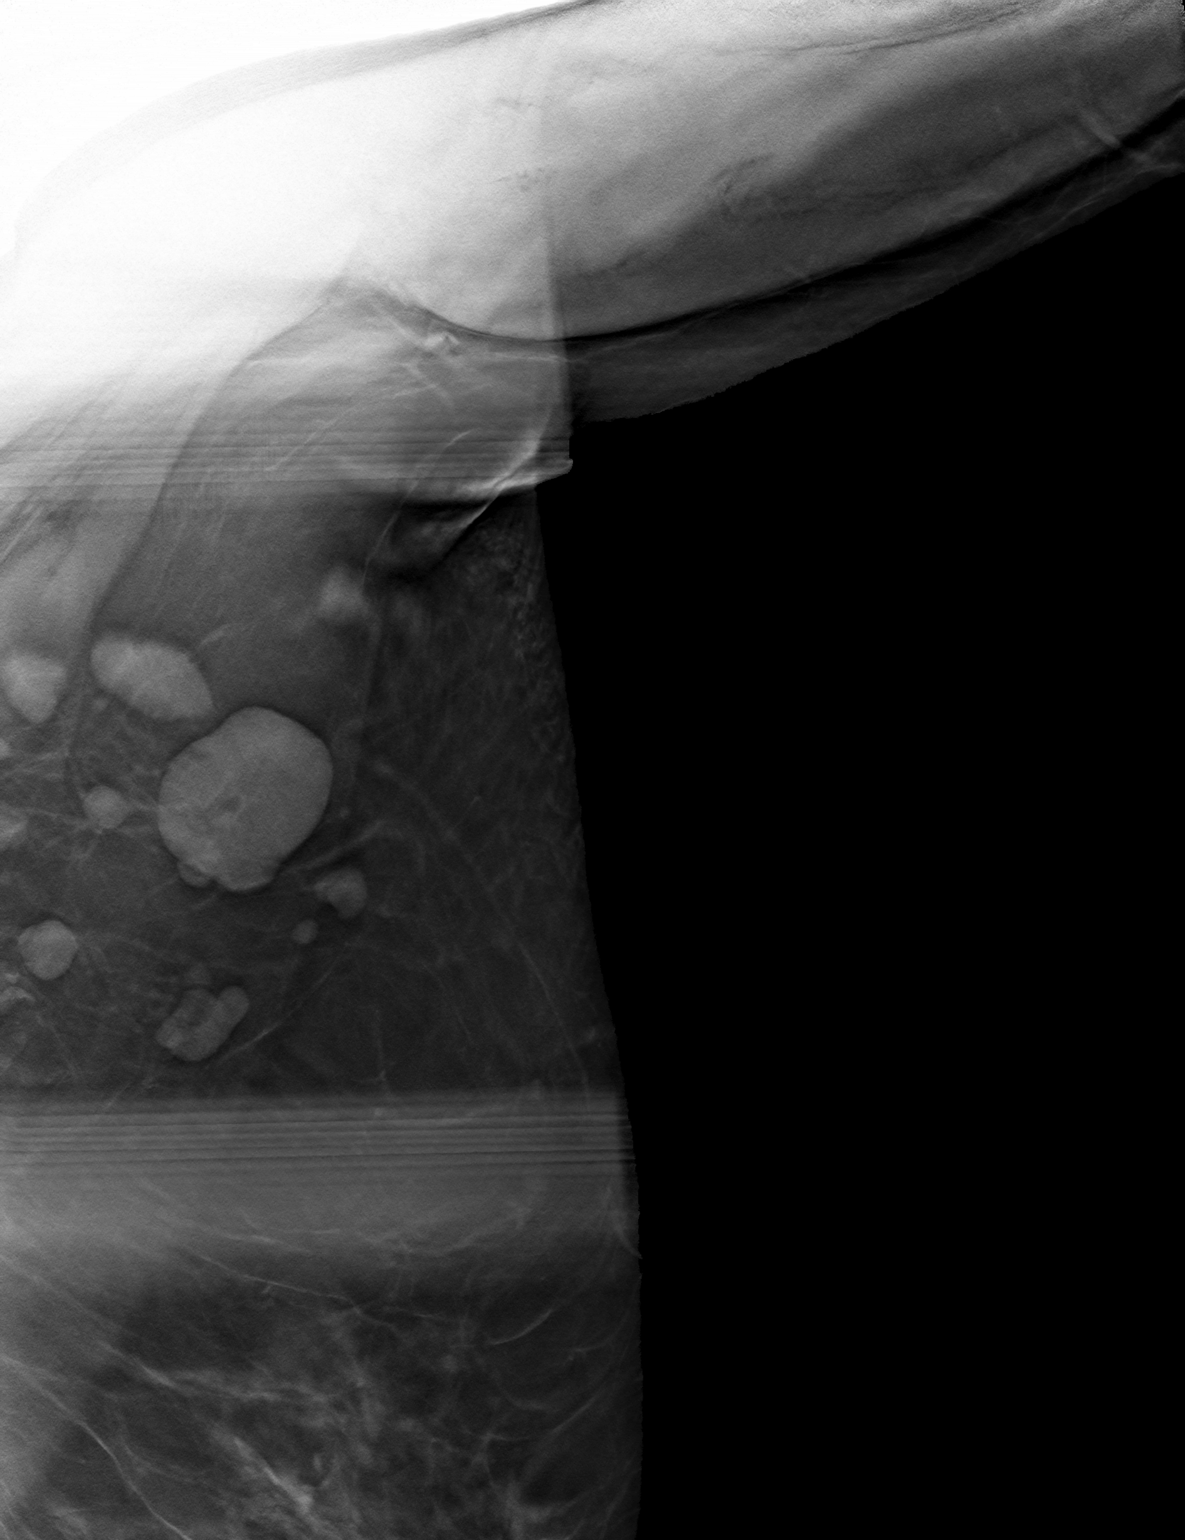
[frame 40/79]
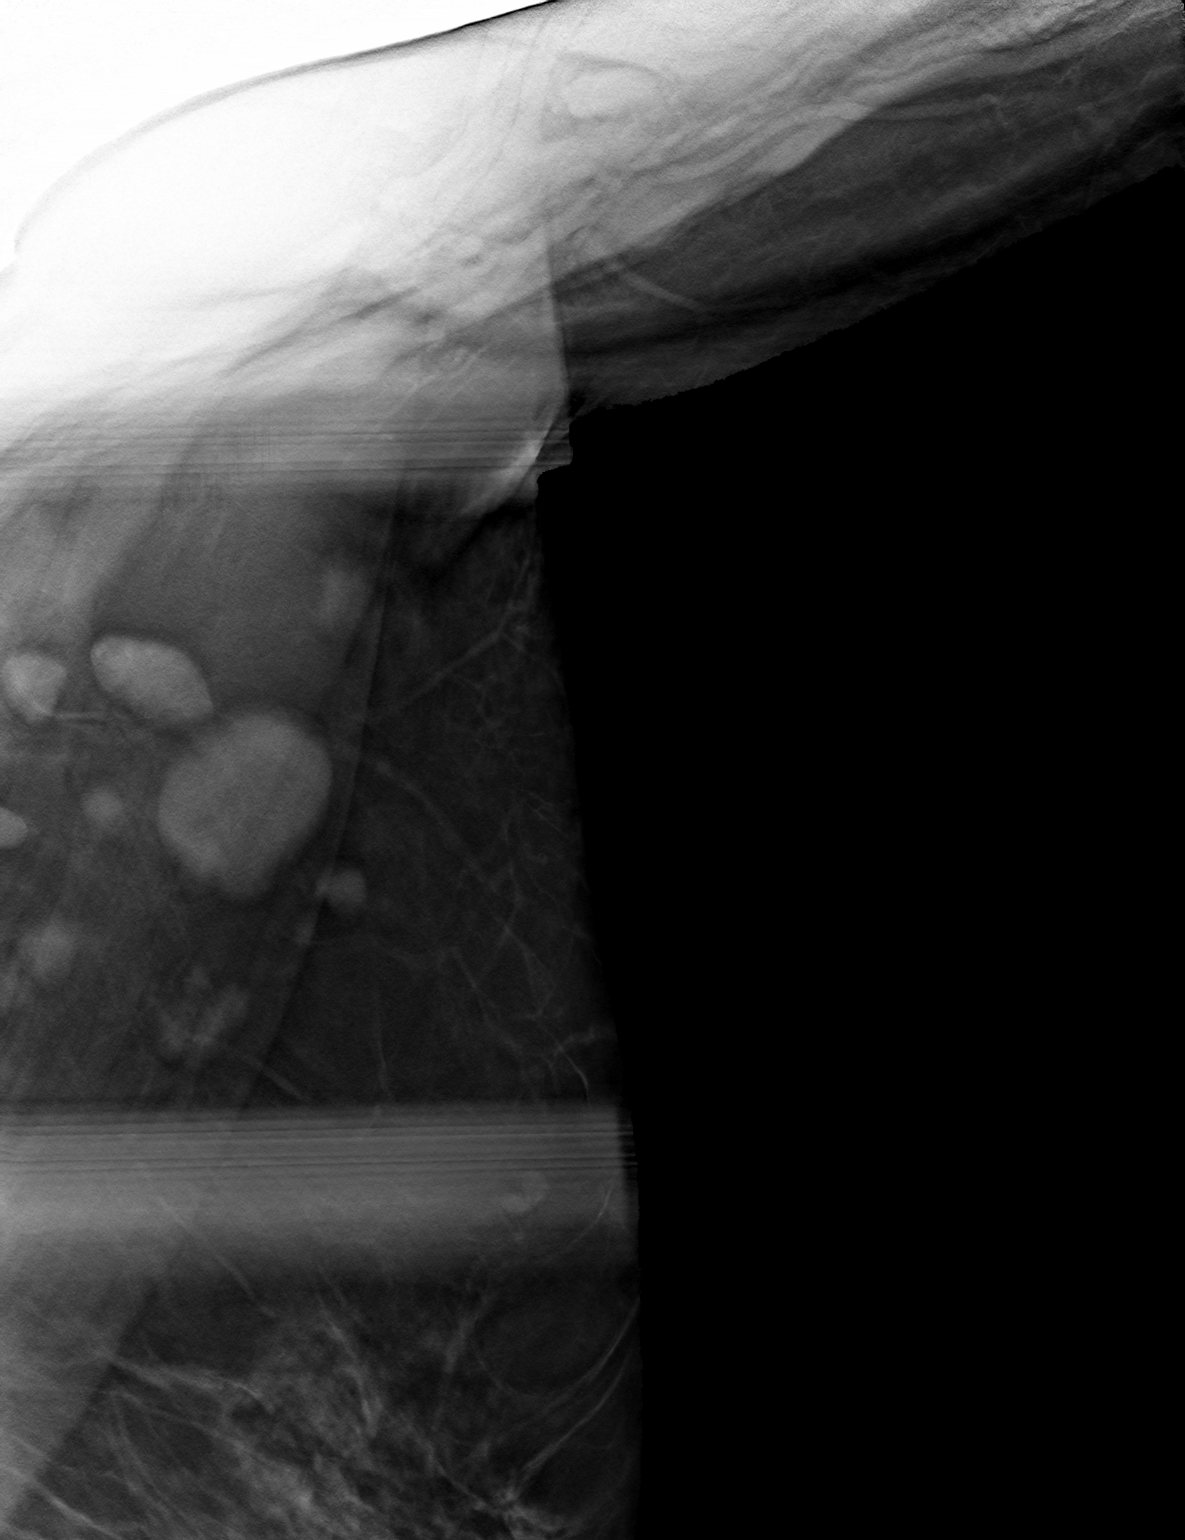

[3 of 6 positions shown; findings below may reference images not displayed]

ACR Breast Density Category b: There are scattered areas of
fibroglandular density.
FINDINGS: There are 1 or 2 abnormal lymph nodes in the left axilla.

On physical exam, no suspicious lumps are identified.

Targeted ultrasound is performed, showing 1 dominant abnormal lymph
node in the left axilla with a cortex measuring nearly 9 mm. Three
other more mildly abnormal nodes are seen as well. Two mildly
abnormal nodes are seen on the right which was imaged for comparison
with cortices measuring between 4 and 5 mm.
IMPRESSION: Lymphadenopathy, left greater than right.

RECOMMENDATION:
Recommend ultrasound-guided biopsy of the dominant abnormal left
axillary lymph node.

I have discussed the findings and recommendations with the patient.
If applicable, a reminder letter will be sent to the patient
regarding the next appointment.

BI-RADS CATEGORY  4: Suspicious.

## 2021-08-13 ENCOUNTER — Other Ambulatory Visit: Payer: Self-pay | Admitting: Family Medicine

## 2021-08-13 DIAGNOSIS — Z1231 Encounter for screening mammogram for malignant neoplasm of breast: Secondary | ICD-10-CM

## 2022-07-30 ENCOUNTER — Other Ambulatory Visit: Payer: Self-pay | Admitting: Family Medicine

## 2022-07-30 DIAGNOSIS — Z1231 Encounter for screening mammogram for malignant neoplasm of breast: Secondary | ICD-10-CM

## 2022-07-30 DIAGNOSIS — E2839 Other primary ovarian failure: Secondary | ICD-10-CM

## 2022-08-06 ENCOUNTER — Ambulatory Visit
Admission: RE | Admit: 2022-08-06 | Discharge: 2022-08-06 | Disposition: A | Discharge status: Home / Self Care | Payer: Medicare HMO | Source: Ambulatory Visit | Attending: Family Medicine | Admitting: Family Medicine

## 2022-08-06 DIAGNOSIS — Z1231 Encounter for screening mammogram for malignant neoplasm of breast: Secondary | ICD-10-CM

## 2022-09-23 NOTE — Progress Notes (Unsigned)
Cardiology Office Note:    Date:  09/24/2022   ID:  Brenda Armstrong, DOB 06-Feb-1956, MRN [id number]  PCP:  Scifres, Peter Minium (Inactive)   [facility] Providers Cardiologist:  Maisie Fus, MD     Referring MD: Ellyn Hack, MD   No chief complaint on file. SVT  History of Present Illness:    Brenda Armstrong is a 67 y.o. female with a hx of asthma, HLD, referral from [facility] for SVT from Dr. Brayton El. She notes her PCP did an EKG and was concerned. She underwent a cardiac monitor. She has asthma. She has been using her inhaler a lot. No CP. SOB with asthma. No prior cardiac w/u  Cardiac monitor that showed 23 events. Frequent runs of PAT.   Retired. She exercises daily. She goes to the gym  Smoked in the past 3-4 years  Past Medical History:  Diagnosis Date   Asthma     No past surgical history on file.  Current Medications: Current Outpatient Medications on File Prior to Visit  Medication Sig Dispense Refill   albuterol (PROVENTIL HFA;VENTOLIN HFA) 108 (90 BASE) MCG/ACT inhaler Inhale 2 puffs into the lungs every 6 (six) hours as needed for wheezing or shortness of breath.     atorvastatin (LIPITOR) 10 MG tablet Take 10 mg by mouth at bedtime.     cholecalciferol (VITAMIN D) 1000 units tablet Take 1,000 Units by mouth daily.     cyanocobalamin 500 MCG tablet Take 500 mcg by mouth daily.     Potassium (POTASSIMIN PO) Take 1 tablet by mouth daily.     vitamin C (ASCORBIC ACID) 500 MG tablet Take 500 mg by mouth daily.     BREO ELLIPTA 200-25 MCG/INH AEPB Inhale 1 puff into the lungs as needed. (Patient not taking: Reported on 09/24/2022)  5   cyclobenzaprine (FLEXERIL) 10 MG tablet Take 1/2 to 1 tablet by mouth every 8 hours as needed for muscle pain/spams (Patient not taking: Reported on 09/24/2022) 15 tablet 0   ferrous sulfate 325 (65 FE) MG EC tablet Take 325 mg by mouth 3 (three) times daily with meals. (Patient not taking:  Reported on 09/24/2022)     naproxen (NAPROSYN) 500 MG tablet Take 1 tablet (500 mg total) by mouth 2 (two) times daily as needed for moderate pain. (Patient not taking: Reported on 08/09/2017) 20 tablet 0   predniSONE (STERAPRED UNI-PAK 21 TAB) 10 MG (21) TBPK tablet Dispense one 6 day pack. Take as directed with food. (Patient not taking: Reported on 08/09/2017) 21 tablet 0   traMADol (ULTRAM) 50 MG tablet Take 1 tablet (50 mg total) by mouth every 6 (six) hours as needed. (Patient not taking: Reported on 08/09/2017) 20 tablet 0   No current facility-administered medications on file prior to visit.     Allergies:   Wheat   Social History   Socioeconomic History   Marital status: Single    Spouse name: Not on file   Number of children: Not on file   Years of education: Not on file   Highest education level: Not on file  Occupational History   Not on file  Tobacco Use   Smoking status: Former    Packs/day: 0.10    Years: 3.00    Additional pack years: 0.00    Total pack years: 0.30    Types: Cigarettes    Quit date: 07/25/2011    Years since quitting: 11.1  Smokeless tobacco: Never  Substance and Sexual Activity   Alcohol use: Not on file   Drug use: Not on file   Sexual activity: Not on file  Other Topics Concern   Not on file  Social History Narrative   Not on file   Social Determinants of Health   Financial Resource Strain: Not on file  Food Insecurity: Not on file  Transportation Needs: Not on file  Physical Activity: Not on file  Stress: Not on file  Social Connections: Not on file     Family History: The patient's family history includes Heart disease in her sister.  ROS:   Please see the history of present illness.     All other systems reviewed and are negative.  EKGs/Labs/Other Studies Reviewed:    The following studies were reviewed today:   EKG:  EKG is  ordered today.  The ekg ordered today demonstrates   09/24/2022- NSR  Recent Labs: No  results found for requested labs within last 365 days.   Recent Lipid Panel No results found for: "CHOL", "TRIG", "HDL", "CHOLHDL", "VLDL", "LDLCALC", "LDLDIRECT"   Risk Assessment/Calculations:         Physical Exam:    VS:   Vitals:   09/24/22 0845  BP: (!) 146/88  Pulse: 77  SpO2: 95%     Wt Readings from Last 3 Encounters:  09/24/22 162 lb 6.4 oz (73.7 kg)  08/09/17 156 lb (70.8 kg)  06/03/15 156 lb (70.8 kg)     GEN:  Well nourished, well developed in no acute distress HEENT: Normal NECK: No JVD; No carotid bruits CARDIAC: RRR, no murmurs, rubs, gallops RESPIRATORY:  Clear to auscultation without rales, wheezing or rhonchi  ABDOMEN: Soft, non-tender, non-distended MUSCULOSKELETAL:  No edema; No deformity  SKIN: Warm and dry NEUROLOGIC:  Alert and oriented x 3 PSYCHIATRIC:  Normal affect   ASSESSMENT:    Runs Paroxysmal Atrial Tachycardia: can be associated with asthma/ frequent albuterol use. She states she is not concerned about it. She denies significant palpitations. We discussed if she develops frequent symptoms, can plan for diltiazem CD 120 mg.  PLAN:    In order of problems listed above:  Follow up PRN       Medication Adjustments/Labs and Tests Ordered: Current medicines are reviewed at length with the patient today.  Concerns regarding medicines are outlined above.  No orders of the defined types were placed in this encounter.  No orders of the defined types were placed in this encounter.   Patient Instructions  Medication Instructions:  Your physician recommends that you continue on your current medications as directed. Please refer to the Current Medication list given to you today.  *If you need a refill on your cardiac medications before your next appointment, please call your pharmacy*   Lab Work: None   Testing/Procedures: None   Follow-Up: At [facility], you and your health needs are our priority.  As part of our  continuing mission to provide you with exceptional heart care, we have created designated Provider Care Teams.  These Care Teams include your primary Cardiologist (physician) and Advanced Practice Providers (APPs -  Physician Assistants and Nurse Practitioners) who all work together to provide you with the care you need, when you need it.  We recommend signing up for the patient portal called "MyChart".  Sign up information is provided on this After Visit Summary.  MyChart is used to connect with patients for Virtual Visits (Telemedicine).  Patients are  able to view lab/test results, encounter notes, upcoming appointments, etc.  Non-urgent messages can be sent to your provider as well.   To learn more about what you can do with MyChart, go to [email or web address].    Your next appointment:    As needed  Provider:   Carolan Clines, MD     Signed, Maisie Fus, MD  09/24/2022 9:03 AM    [facility]

## 2022-09-24 ENCOUNTER — Encounter: Payer: Self-pay | Admitting: Internal Medicine

## 2022-09-24 ENCOUNTER — Ambulatory Visit: Payer: Medicare HMO | Attending: Internal Medicine | Admitting: Internal Medicine

## 2022-09-24 VITALS — BP 146/88 | HR 77 | Ht 62.0 in | Wt 162.4 lb

## 2022-09-24 DIAGNOSIS — I4719 Other supraventricular tachycardia: Secondary | ICD-10-CM | POA: Diagnosis not present

## 2022-09-24 NOTE — Addendum Note (Signed)
Addended by: Jethro Bolus A on: 09/24/2022 01:45 PM   Modules accepted: Orders

## 2022-09-24 NOTE — Patient Instructions (Signed)
Medication Instructions:  Your physician recommends that you continue on your current medications as directed. Please refer to the Current Medication list given to you today.  *If you need a refill on your cardiac medications before your next appointment, please call your pharmacy*   Lab Work: None   Testing/Procedures: None   Follow-Up: At [facility], you and your health needs are our priority.  As part of our continuing mission to provide you with exceptional heart care, we have created designated Provider Care Teams.  These Care Teams include your primary Cardiologist (physician) and Advanced Practice Providers (APPs -  Physician Assistants and Nurse Practitioners) who all work together to provide you with the care you need, when you need it.  We recommend signing up for the patient portal called "MyChart".  Sign up information is provided on this After Visit Summary.  MyChart is used to connect with patients for Virtual Visits (Telemedicine).  Patients are able to view lab/test results, encounter notes, upcoming appointments, etc.  Non-urgent messages can be sent to your provider as well.   To learn more about what you can do with MyChart, go to [email or web address].    Your next appointment:    As needed  Provider:   Carolan Clines, MD

## 2022-09-26 NOTE — Addendum Note (Signed)
Addended by: Jethro Bolus A on: 09/26/2022 08:34 AM   Modules accepted: Orders

## 2022-10-30 ENCOUNTER — Other Ambulatory Visit: Payer: Self-pay | Admitting: Family Medicine

## 2022-10-30 DIAGNOSIS — R599 Enlarged lymph nodes, unspecified: Secondary | ICD-10-CM

## 2022-11-04 ENCOUNTER — Ambulatory Visit
Admission: RE | Admit: 2022-11-04 | Discharge: 2022-11-04 | Disposition: A | Discharge status: Home / Self Care | Payer: Medicare HMO | Source: Ambulatory Visit | Attending: Family Medicine | Admitting: Family Medicine

## 2022-11-04 DIAGNOSIS — R599 Enlarged lymph nodes, unspecified: Secondary | ICD-10-CM

## 2022-12-17 ENCOUNTER — Other Ambulatory Visit (HOSPITAL_BASED_OUTPATIENT_CLINIC_OR_DEPARTMENT_OTHER): Payer: Self-pay | Admitting: Family Medicine

## 2022-12-17 DIAGNOSIS — J441 Chronic obstructive pulmonary disease with (acute) exacerbation: Secondary | ICD-10-CM

## 2023-01-09 ENCOUNTER — Ambulatory Visit
Admission: RE | Admit: 2023-01-09 | Discharge: 2023-01-09 | Disposition: A | Discharge status: Home / Self Care | Payer: Medicare HMO | Source: Ambulatory Visit | Attending: Family Medicine | Admitting: Family Medicine

## 2023-01-09 DIAGNOSIS — E2839 Other primary ovarian failure: Secondary | ICD-10-CM

## 2023-07-10 ENCOUNTER — Other Ambulatory Visit: Payer: Self-pay | Admitting: Family Medicine

## 2023-07-10 DIAGNOSIS — K4090 Unilateral inguinal hernia, without obstruction or gangrene, not specified as recurrent: Secondary | ICD-10-CM

## 2023-07-14 ENCOUNTER — Encounter: Payer: Self-pay | Admitting: Family Medicine

## 2023-07-24 LAB — COLOGUARD: COLOGUARD: NEGATIVE

## 2023-08-05 ENCOUNTER — Ambulatory Visit
Admission: RE | Admit: 2023-08-05 | Discharge: 2023-08-05 | Disposition: A | Discharge status: Home / Self Care | Payer: Medicare HMO | Source: Ambulatory Visit | Attending: Family Medicine | Admitting: Family Medicine

## 2023-08-05 DIAGNOSIS — K4090 Unilateral inguinal hernia, without obstruction or gangrene, not specified as recurrent: Secondary | ICD-10-CM

## 2023-08-11 ENCOUNTER — Ambulatory Visit
Admission: RE | Admit: 2023-08-11 | Discharge: 2023-08-11 | Disposition: A | Discharge status: Home / Self Care | Source: Ambulatory Visit | Attending: Family Medicine | Admitting: Family Medicine

## 2023-08-11 DIAGNOSIS — J441 Chronic obstructive pulmonary disease with (acute) exacerbation: Secondary | ICD-10-CM

## 2023-08-18 ENCOUNTER — Encounter: Payer: Self-pay | Admitting: Family Medicine

## 2023-08-21 ENCOUNTER — Other Ambulatory Visit: Payer: Self-pay | Admitting: Family Medicine

## 2023-08-21 ENCOUNTER — Ambulatory Visit
Admission: RE | Admit: 2023-08-21 | Discharge: 2023-08-21 | Disposition: A | Discharge status: Home / Self Care | Source: Ambulatory Visit | Attending: Family Medicine | Admitting: Family Medicine

## 2023-08-21 DIAGNOSIS — R160 Hepatomegaly, not elsewhere classified: Secondary | ICD-10-CM

## 2023-08-21 MED ORDER — IOPAMIDOL (ISOVUE-300) INJECTION 61%
100.0000 mL | Freq: Once | INTRAVENOUS | Status: AC | PRN
  Administered 2023-08-21: 100 mL via INTRAVENOUS

## 2023-08-31 ENCOUNTER — Emergency Department (HOSPITAL_COMMUNITY)
Admission: EM | Admit: 2023-08-31 | Discharge: 2023-08-31 | Disposition: A | Discharge status: Home / Self Care | Attending: Emergency Medicine | Admitting: Emergency Medicine

## 2023-08-31 ENCOUNTER — Other Ambulatory Visit: Payer: Self-pay

## 2023-08-31 ENCOUNTER — Encounter (HOSPITAL_COMMUNITY): Payer: Self-pay

## 2023-08-31 DIAGNOSIS — D1803 Hemangioma of intra-abdominal structures: Secondary | ICD-10-CM

## 2023-08-31 DIAGNOSIS — J449 Chronic obstructive pulmonary disease, unspecified: Secondary | ICD-10-CM | POA: Insufficient documentation

## 2023-08-31 DIAGNOSIS — K648 Other hemorrhoids: Secondary | ICD-10-CM

## 2023-08-31 DIAGNOSIS — R1011 Right upper quadrant pain: Secondary | ICD-10-CM | POA: Diagnosis present

## 2023-08-31 LAB — CBC WITH DIFFERENTIAL/PLATELET
Abs Immature Granulocytes: 0.03 10*3/uL (ref 0.00–0.07)
Basophils Absolute: 0.1 10*3/uL (ref 0.0–0.1)
Basophils Relative: 1 %
Eosinophils Absolute: 0.6 10*3/uL — ABNORMAL HIGH (ref 0.0–0.5)
Eosinophils Relative: 5 %
HCT: 39.2 % (ref 36.0–46.0)
Hemoglobin: 12.5 g/dL (ref 12.0–15.0)
Immature Granulocytes: 0 %
Lymphocytes Relative: 18 %
Lymphs Abs: 1.9 10*3/uL (ref 0.7–4.0)
MCH: 27.7 pg (ref 26.0–34.0)
MCHC: 31.9 g/dL (ref 30.0–36.0)
MCV: 86.9 fL (ref 80.0–100.0)
Monocytes Absolute: 0.5 10*3/uL (ref 0.1–1.0)
Monocytes Relative: 5 %
Neutro Abs: 7.3 10*3/uL (ref 1.7–7.7)
Neutrophils Relative %: 71 %
Platelets: 270 10*3/uL (ref 150–400)
RBC: 4.51 MIL/uL (ref 3.87–5.11)
RDW: 13.8 % (ref 11.5–15.5)
WBC: 10.3 10*3/uL (ref 4.0–10.5)
nRBC: 0 % (ref 0.0–0.2)

## 2023-08-31 LAB — BASIC METABOLIC PANEL WITH GFR
Anion gap: 14 (ref 5–15)
BUN: 12 mg/dL (ref 8–23)
CO2: 22 mmol/L (ref 22–32)
Calcium: 10 mg/dL (ref 8.9–10.3)
Chloride: 102 mmol/L (ref 98–111)
Creatinine, Ser: 0.87 mg/dL (ref 0.44–1.00)
GFR, Estimated: >60 mL/min (ref >60)
Glucose, Bld: 121 mg/dL — ABNORMAL HIGH (ref 70–99)
Potassium: 4.4 mmol/L (ref 3.5–5.1)
Sodium: 138 mmol/L (ref 135–145)

## 2023-08-31 LAB — POC OCCULT BLOOD, ED: Fecal Occult Bld: NEGATIVE

## 2023-08-31 LAB — HEPATIC FUNCTION PANEL
ALT: 31 U/L (ref 0–44)
AST: 25 U/L (ref 15–41)
Albumin: 3.9 g/dL (ref 3.5–5.0)
Alkaline Phosphatase: 58 U/L (ref 38–126)
Bilirubin, Direct: <0.1 mg/dL (ref 0.0–0.2)
Total Bilirubin: 0.7 mg/dL (ref 0.0–1.2)
Total Protein: 7.3 g/dL (ref 6.5–8.1)

## 2023-08-31 LAB — LIPASE, BLOOD: Lipase: 26 U/L (ref 11–51)

## 2023-08-31 MED ORDER — ACETAMINOPHEN 325 MG PO TABS
650.0000 mg | ORAL_TABLET | Freq: Once | ORAL | Status: AC
  Administered 2023-08-31: 650 mg via ORAL
  Filled 2023-08-31: qty 2

## 2023-08-31 MED ORDER — OXYCODONE-ACETAMINOPHEN 5-325 MG PO TABS: 1.0000 | ORAL_TABLET | Freq: Three times a day (TID) | ORAL | 0 refills | Status: AC | PRN

## 2023-08-31 MED ORDER — KETOROLAC TROMETHAMINE 15 MG/ML IJ SOLN
15.0000 mg | Freq: Once | INTRAMUSCULAR | Status: AC
  Administered 2023-08-31: 15 mg via INTRAMUSCULAR
  Filled 2023-08-31: qty 1

## 2023-08-31 MED ORDER — MORPHINE SULFATE (PF) 4 MG/ML IV SOLN: 4.0000 mg | Freq: Once | INTRAVENOUS | Status: DC

## 2023-08-31 MED ORDER — HYDROCORTISONE (PERIANAL) 2.5 % EX CREA: 1.0000 | TOPICAL_CREAM | Freq: Two times a day (BID) | CUTANEOUS | 0 refills | Status: DC

## 2023-08-31 MED ORDER — OXYCODONE-ACETAMINOPHEN 5-325 MG PO TABS
1.0000 | ORAL_TABLET | Freq: Once | ORAL | Status: AC
  Administered 2023-08-31: 1 via ORAL
  Filled 2023-08-31: qty 1

## 2023-08-31 MED ORDER — HYDROCORTISONE (PERIANAL) 2.5 % EX CREA: 1.0000 | TOPICAL_CREAM | Freq: Two times a day (BID) | CUTANEOUS | 0 refills | Status: AC

## 2023-08-31 NOTE — Discharge Instructions (Addendum)
 Please follow up with GI and your PCP in regards to recent visit and symptoms.  Today your exam shows you most likely have a hemorrhoid causing your symptoms, which is usually a benign process.  Your occult exam was negative for a bleed at this time and so with your blood count being reassuring you are safe to be discharged.  Please follow-up with the GI specialist and the general surgeon for the liver hemangioma.   I have given you few days with the pain meds. Please only use these if 800mg  of ibruprofen every 8 hours does not control your pain. If pain is not controlled by this please return to the ER.  I have attached a GI specialist for you to call if your symptoms persist; however, in the meantime, you may try sitz baths and the Anusol cream I have prescribed for you along with metamucil and high fiber diet.  If symptoms change or worsen, please return to the ER.

## 2023-08-31 NOTE — ED Triage Notes (Signed)
 Pt bib ems from the doctor office c/o concerning for bleed in stomach.   Pt had a CT on abdomen a week ago. Pt has had a known mass on liver for some time. The doctor reviewed pt results and think the pt may be bleeding in stomach.   Pt denies sob, dizziness, fatigue. Pt had blood in stool two weeks ago.

## 2023-08-31 NOTE — ED Provider Notes (Signed)
 [place] EMERGENCY DEPARTMENT AT [facility] Provider Note   CSN: [id number] Arrival date & time: 08/31/23  1047     History  Chief Complaint  Patient presents with  . Abdominal Pain    Brenda Armstrong is a 68 y.o. female history of COPD, atrial tachycardia presented for abnormal CT scan.  Patient has been having right upper quadrant abdominal pain for quite some time however had a CT scan done by her primary care provider a week ago that shows a large liver mass and her primary care provider recommended a surgical consult.  Patient denies chest pain shortness of breath vomiting melena.  Patient states 2 weeks ago she had red blood in her stool however since then it has resolved.  Patient has no history of peptic ulcers.  Patient has never had a colonoscopy before.   Home Medications Prior to Admission medications   Medication Sig Start Date End Date Taking? Authorizing Provider  hydrocortisone (ANUSOL-HC) 2.5 % rectal cream Place 1 Application rectally 2 (two) times daily. 08/31/23  Yes Shadiamond Koska, Beverly Gust, PA-C  albuterol (PROVENTIL HFA;VENTOLIN HFA) 108 (90 BASE) MCG/ACT inhaler Inhale 2 puffs into the lungs every 6 (six) hours as needed for wheezing or shortness of breath.    [provider]  atorvastatin (LIPITOR) 10 MG tablet Take 10 mg by mouth at bedtime. 07/30/22   [provider]  BREO ELLIPTA 200-25 MCG/INH AEPB Inhale 1 puff into the lungs as needed. Patient not taking: Reported on 09/24/2022 07/27/17   [provider]  cholecalciferol (VITAMIN D) 1000 units tablet Take 1,000 Units by mouth daily.    [provider]  cyanocobalamin 500 MCG tablet Take 500 mcg by mouth daily.    [provider]  cyclobenzaprine (FLEXERIL) 10 MG tablet Take 1/2 to 1 tablet by mouth every 8 hours as needed for muscle pain/spams Patient not taking: Reported on 09/24/2022 03/17/17   Sudie Grumbling, NP  ferrous sulfate 325 (65 FE) MG EC  tablet Take 325 mg by mouth 3 (three) times daily with meals. Patient not taking: Reported on 09/24/2022    [provider]  naproxen (NAPROSYN) 500 MG tablet Take 1 tablet (500 mg total) by mouth 2 (two) times daily as needed for moderate pain. Patient not taking: Reported on 08/09/2017 03/17/17   Sudie Grumbling, NP  Potassium (POTASSIMIN PO) Take 1 tablet by mouth daily.    [provider]  predniSONE (STERAPRED UNI-PAK 21 TAB) 10 MG (21) TBPK tablet Dispense one 6 day pack. Take as directed with food. Patient not taking: Reported on 08/09/2017 03/26/17   Hayden Rasmussen, NP  traMADol (ULTRAM) 50 MG tablet Take 1 tablet (50 mg total) by mouth every 6 (six) hours as needed. Patient not taking: Reported on 08/09/2017 03/26/17   Hayden Rasmussen, NP  vitamin C (ASCORBIC ACID) 500 MG tablet Take 500 mg by mouth daily.    [provider]      Allergies    Wheat    Review of Systems   Review of Systems  Gastrointestinal:  Positive for abdominal pain.    Physical Exam Updated Vital Signs BP (!) 164/104   Pulse 87   Temp 98.3 F (36.8 C)   Resp 20   Ht 5\' 3"  (1.6 m)   Wt 71.2 kg   SpO2 98%   BMI 27.81 kg/m  Physical Exam Vitals reviewed.  Constitutional:      General: She is not in acute distress.  HENT:     Head: Normocephalic and atraumatic.  Eyes:     Extraocular Movements: Extraocular movements intact.     Conjunctiva/sclera: Conjunctivae normal.     Pupils: Pupils are equal, round, and reactive to light.  Cardiovascular:     Rate and Rhythm: Normal rate and regular rhythm.     Pulses: Normal pulses.     Heart sounds: Normal heart sounds.     Comments: 2+ bilateral radial/dorsalis pedis pulses with regular rate Pulmonary:     Effort: Pulmonary effort is normal. No respiratory distress.     Breath sounds: Normal breath sounds.  Abdominal:     Palpations: Abdomen is soft. There is mass.     Tenderness: There is abdominal tenderness in the right upper  quadrant. There is no guarding or rebound. Negative signs include Murphy's sign, Rovsing's sign, McBurney's sign and psoas sign.  Genitourinary:    Comments: Chaperone: Josh, RN No gross melena or hematochezia noted No anal fissures noted Rectal tone intact Musculoskeletal:        General: Normal range of motion.     Cervical back: Normal range of motion and neck supple.     Comments: 5 out of 5 bilateral grip/leg extension strength  Skin:    General: Skin is warm and dry.     Capillary Refill: Capillary refill takes less than 2 seconds.  Neurological:     General: No focal deficit present.     Mental Status: She is alert and oriented to person, place, and time.     Comments: Sensation intact in all 4 limbs  Psychiatric:        Mood and Affect: Mood normal.     ED Results / Procedures / Treatments   Labs (all labs ordered are listed, but only abnormal results are displayed) Labs Reviewed  CBC WITH DIFFERENTIAL/PLATELET - Abnormal; Notable for the following components:      Result Value   Eosinophils Absolute 0.6 (*)    All other components within normal limits  BASIC METABOLIC PANEL WITH GFR - Abnormal; Notable for the following components:   Glucose, Bld 121 (*)    All other components within normal limits  LIPASE, BLOOD  HEPATIC FUNCTION PANEL  POC OCCULT BLOOD, ED    EKG None  Radiology No results found.  Procedures Procedures    Medications Ordered in ED Medications  morphine (PF) 4 MG/ML injection 4 mg (has no administration in time range)  oxyCODONE-acetaminophen (PERCOCET/ROXICET) 5-325 MG per tablet 1 tablet (1 tablet Oral Given 08/31/23 1350)    ED Course/ Medical Decision Making/ A&P Clinical Course as of 08/31/23 1505  Mon Aug 31, 2023  1457 Abd pain, outpt CT - hemangioma 11x13cm, 2 wa blood in stool but none since, no prior colonoscopy, occult neg, - GS said outpt f/u [ ]  f/u lipase, HFP, & pain control [HG]    Clinical Course User Index [HG]  Renella Cunas, MD                                 Medical Decision Making  Brenda Armstrong 68 y.o. presented today for abdominal pain.  Working DDx that I considered at this time includes, but not limited to, mass, gastroenteritis, colitis, small bowel obstruction, appendicitis, cholecystitis, hepatobiliary pathology, gastritis, PUD, ACS, aortic dissection, diverticulosis/diverticulitis, pancreatitis, nephrolithiasis, medication induced, AAA, UTI, pyelonephritis, ruptured ectopic pregnancy, PID, ovarian torsion.  R/o DDx: gastroenteritis, colitis, small  bowel obstruction, appendicitis, cholecystitis, hepatobiliary pathology, gastritis, PUD, ACS, aortic dissection, diverticulosis/diverticulitis, pancreatitis, nephrolithiasis, medication induced, AAA, UTI, pyelonephritis, ruptured ectopic pregnancy, PID, ovarian torsion: These are considered less likely due to history of present illness, physical exam, labs/imaging findings.  Review of prior external notes: 09/24/2022 office visit  Unique Tests and My Independent Interpretation:  CBC with differential: Unremarkable BMP: Unremarkable Phone care occult test: Negative  Social Determinants of Health: none  Discussion with Independent Historian: None  Discussion of Management of Tests:  Dwain Sarna, MD General Surgery  Risk: Medium: prescription drug management  Risk Stratification Score: None  Plan: On exam patient was no acute distress with stable vitals.  Patient does have tender abdomen without peritoneal signs.  I am able to palpate patient's liver mass.  Patient states that she is no longer having blood in her stool and rectal exam with chaperone present was negative.  Patient's labs are unremarkable.  Patient is not on any blood thinners.  Do feel patient's abdominal pain is related to the mass and do not feel she is having upper GI bleed or life-threatening bleed at this time that would require admission.  Will recommend that she  follows up with GI as she most likely has a hemorrhoid given the fact she reportedly has red blood in her stool and not melena.  I spoke to general surgery and they state that patient's mass as an outpatient follow-up and so we will give pain meds to control her pain and most likely discharge in a few days with the pain medications and have her follow-up outpatient.  Patient given in sign out by Clemon Chambers, MD.  Please review their note for patient HPI, physical exam, workup.  At this time the plan is to reevaluate patient and at labs and if stable can discharge with outpatient follow-up.  This chart was dictated using voice recognition software.  Despite best efforts to proofread,  errors can occur which can change the documentation meaning.         Final Clinical Impression(s) / ED Diagnoses Final diagnoses:  Cavernous hemangioma of liver  Other hemorrhoids    Rx / DC Orders ED Discharge Orders          Ordered    hydrocortisone (ANUSOL-HC) 2.5 % rectal cream  2 times daily        08/31/23 1419              Remi Deter 08/31/23 1505    Arby Barrette, MD 09/01/23 [phone]

## 2023-08-31 NOTE — ED Provider Notes (Signed)
  Physical Exam  BP (!) 149/100   Pulse 81   Temp 98.4 F (36.9 C) (Oral)   Resp 16   Ht 5\' 3"  (1.6 m)   Wt 71.2 kg   SpO2 100%   BMI 27.81 kg/m   Physical Exam  Procedures  Procedures  ED Course / MDM   Clinical Course as of 08/31/23 1707  Mon Aug 31, 2023  1457 Abd pain, outpt CT - hemangioma 11x13cm, 2 wa blood in stool but none since, no prior colonoscopy, occult neg, - GS said outpt f/u [ ]  f/u lipase, HFP, & pain control [HG]    Clinical Course User Index [HG] Renella Cunas, MD   Medical Decision Making Amount and/or Complexity of Data Reviewed Labs: ordered.  Risk OTC drugs. Prescription drug management.   At the time of handoff, I was following up lipase which was reassuring 26.  Low suspicion for pancreatitis.  I was also following up hepatic function panel which was within normal limits.  This is reassuring given hemangioma on CT abdomen and pelvis.   Patient's pain was controlled with tordol and percocet.  Patient is felt to be hemodynamically stable for discharge with outpatient follow-up.   Renella Cunas, PGY-2 Emergency Medicine     Renella Cunas, MD 08/31/23 1610    Glyn Ade, MD 08/31/23 [phone]
